# Patient Record
Sex: Female | Born: 1935 | Race: White | Hispanic: No | Smoking: Former smoker
Health system: Southern US, Community
[De-identification: ages and names within clinical notes are randomized; demographics above are authoritative.]

## PROBLEM LIST (undated history)

## (undated) DIAGNOSIS — F329 Major depressive disorder, single episode, unspecified: Secondary | ICD-10-CM

## (undated) DIAGNOSIS — J449 Chronic obstructive pulmonary disease, unspecified: Secondary | ICD-10-CM

## (undated) DIAGNOSIS — F32A Depression, unspecified: Secondary | ICD-10-CM

## (undated) HISTORY — DX: Chronic obstructive pulmonary disease, unspecified: J44.9

## (undated) HISTORY — DX: Depression, unspecified: F32.A

## (undated) HISTORY — PX: TONSILLECTOMY: SUR1361

## (undated) HISTORY — DX: Major depressive disorder, single episode, unspecified: F32.9

---

## 2008-03-13 ENCOUNTER — Ambulatory Visit: Payer: Self-pay | Admitting: Family Medicine

## 2008-04-10 ENCOUNTER — Ambulatory Visit: Payer: Self-pay | Admitting: Family Medicine

## 2009-05-02 IMAGING — CR DG CHEST 2V
1 series · 3 of 3 positions shown · non-contrast
Comparison: none

REASON FOR EXAM: f/u pneumonia    copd
COMMENTS:

[Series 1: view not recorded · 0.17mm/px · 3 of 3 slices shown]
[im 1/3]
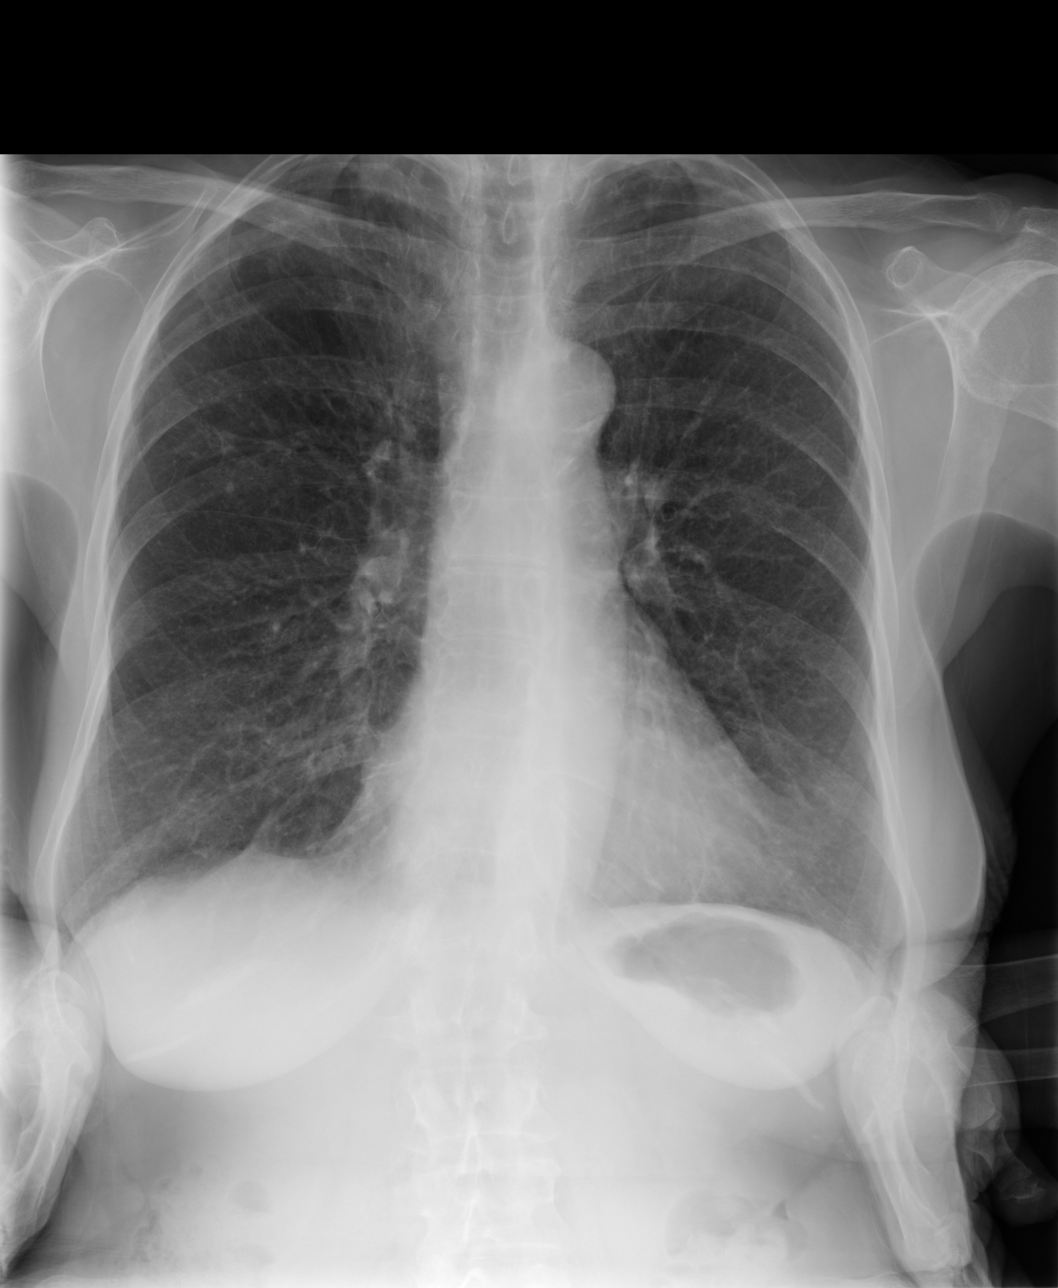
[im 2/3]
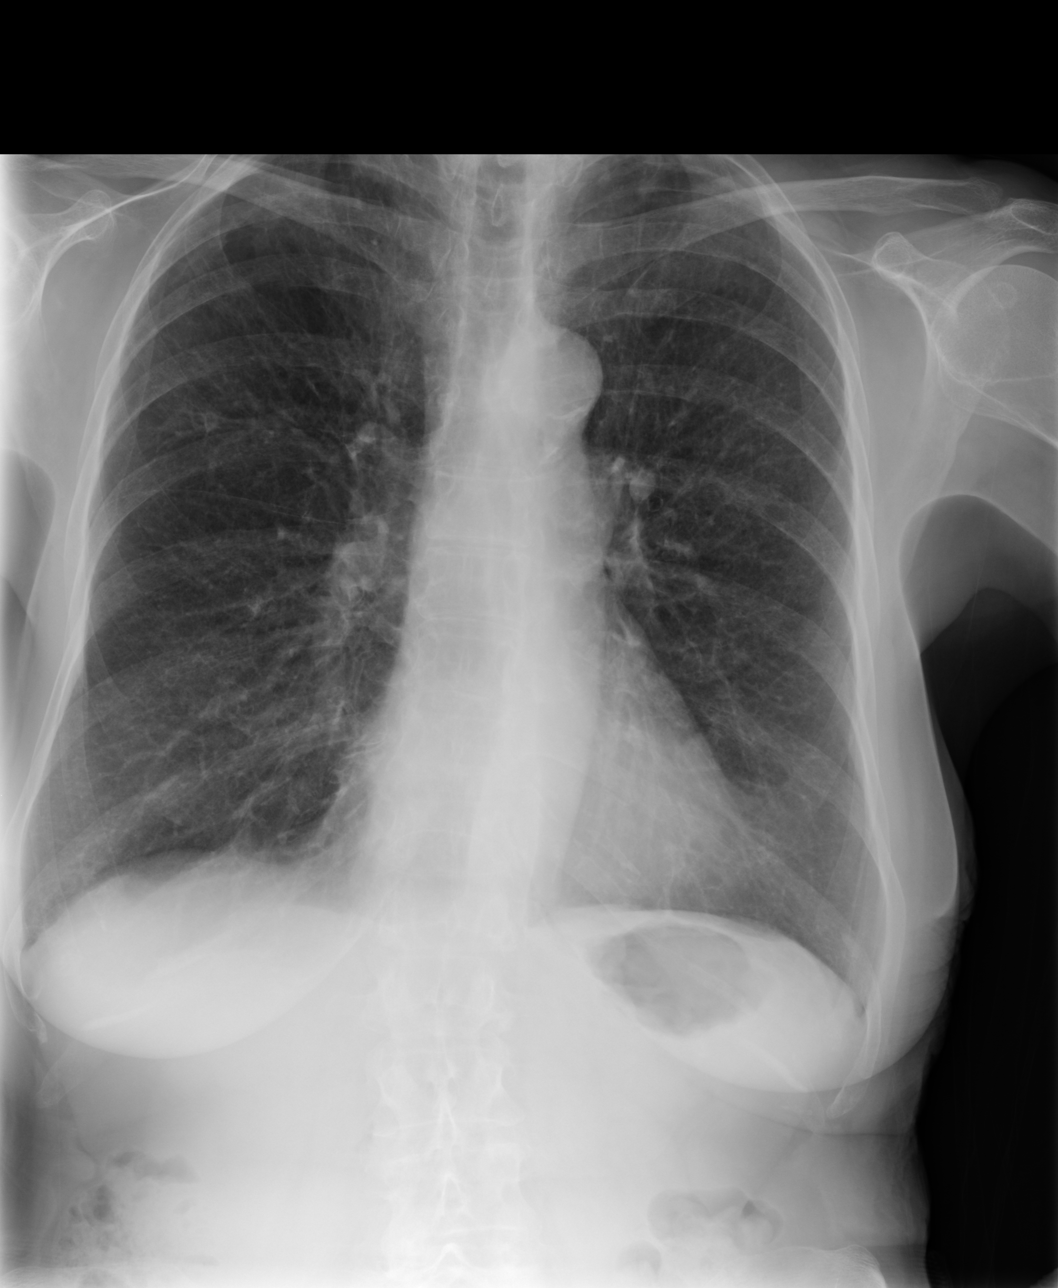
[im 3/3]
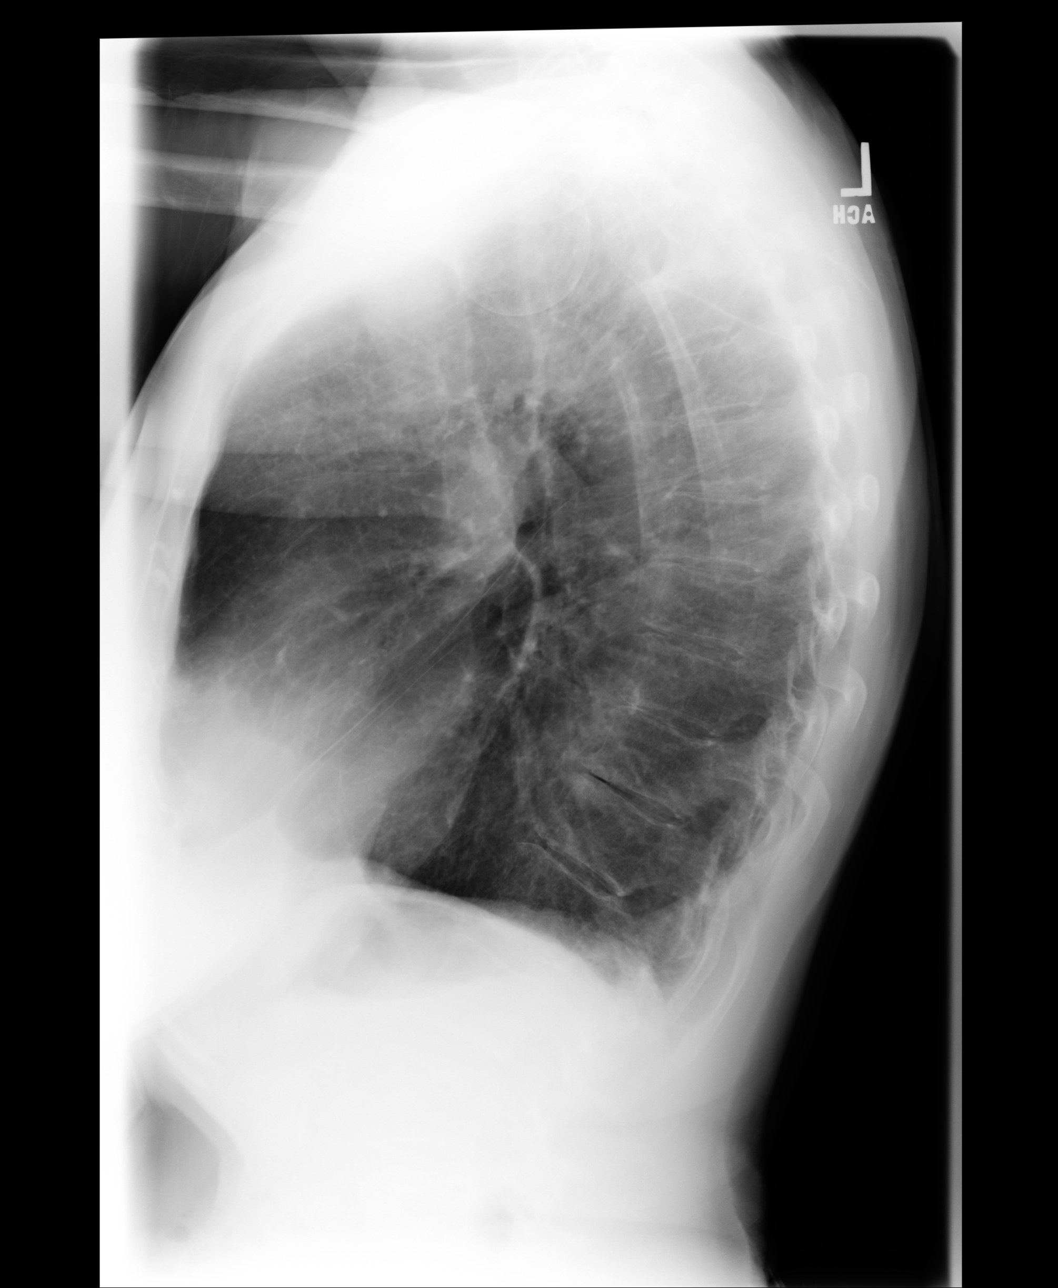

[3 of 3 positions shown; findings below may reference images not displayed]

PROCEDURE:     MDR - MDR CHEST PA(OR AP) AND LATERAL  - April 10, 2008  [DATE]

RESULT:     Comparison is made to a prior exam of 03/13/2008.

There is improvement in the previously observed patchy right upper lobe
infiltrate. A minimal residual increase in density persists in this area
compatible with slight residual infiltrate or possibly developing fibrosis.
Mild pleural and parenchymal fibrosis is again seen at the lung apices. No
new pulmonary infiltrates are seen. The heart size is normal. The chest is
hyperexpanded compatible with COPD.
IMPRESSION: 1.  There has been near complete clearing of the previously noted patchy
infiltrate in the right upper lobe. As mentioned above, minimal residual
density persists in the area compatible with slight residual infiltrate or
developing fibrotic change.
2.  No new infiltrates are seen.
3.  The heart size is normal.
4.  COPD.

## 2012-08-02 LAB — LIPID PANEL
Cholesterol: 202 mg/dL — AB (ref 0–200)
HDL: 70 mg/dL (ref 35–70)
LDL Cholesterol: 107 mg/dL
TRIGLYCERIDES: 124 mg/dL (ref 40–160)

## 2012-08-03 LAB — BASIC METABOLIC PANEL: GLUCOSE: 87 mg/dL

## 2014-05-31 DIAGNOSIS — F339 Major depressive disorder, recurrent, unspecified: Secondary | ICD-10-CM | POA: Insufficient documentation

## 2014-05-31 DIAGNOSIS — J432 Centrilobular emphysema: Secondary | ICD-10-CM | POA: Insufficient documentation

## 2014-12-14 ENCOUNTER — Encounter: Payer: Self-pay | Admitting: Family Medicine

## 2014-12-14 ENCOUNTER — Ambulatory Visit (INDEPENDENT_AMBULATORY_CARE_PROVIDER_SITE_OTHER): Payer: Medicare Other | Admitting: Family Medicine

## 2014-12-14 VITALS — BP 110/60 | HR 80 | Ht 66.0 in | Wt 142.0 lb

## 2014-12-14 DIAGNOSIS — J432 Centrilobular emphysema: Secondary | ICD-10-CM

## 2014-12-14 DIAGNOSIS — J0100 Acute maxillary sinusitis, unspecified: Secondary | ICD-10-CM | POA: Diagnosis not present

## 2014-12-14 DIAGNOSIS — J209 Acute bronchitis, unspecified: Secondary | ICD-10-CM

## 2014-12-14 MED ORDER — FLUTICASONE-SALMETEROL 250-50 MCG/DOSE IN AEPB: 1.0000 | INHALATION_SPRAY | Freq: Two times a day (BID) | RESPIRATORY_TRACT | Status: DC

## 2014-12-14 MED ORDER — ALBUTEROL SULFATE HFA 108 (90 BASE) MCG/ACT IN AERS: 2.0000 | INHALATION_SPRAY | Freq: Four times a day (QID) | RESPIRATORY_TRACT | Status: DC

## 2014-12-14 MED ORDER — GUAIFENESIN-CODEINE 100-10 MG/5ML PO SOLN: 5.0000 mL | Freq: Three times a day (TID) | ORAL | Status: DC | PRN

## 2014-12-14 MED ORDER — AZITHROMYCIN 250 MG PO TABS: ORAL_TABLET | ORAL | Status: DC

## 2014-12-14 NOTE — Progress Notes (Signed)
Name: Marie Martin   MRN: [id number]    DOB: 1931/06/22   Date:12/14/2014       Progress Note  Subjective  Chief Complaint  Chief Complaint  Patient presents with  . Sinusitis    Sinusitis This is a new problem. The current episode started 1 to 4 weeks ago. The problem is unchanged. There has been no fever. She is experiencing no pain. Pertinent negatives include no chills, congestion, coughing, diaphoresis, ear pain, headaches, hoarse voice, neck pain, shortness of breath, sinus pressure, sneezing, sore throat or swollen glands. Past treatments include acetaminophen. The treatment provided mild relief.  Cough This is a new problem. The current episode started 1 to 4 weeks ago. The problem has been waxing and waning. The cough is productive of purulent sputum. Pertinent negatives include no chest pain, chills, ear pain, fever, headaches, heartburn, hemoptysis, myalgias, rash, sore throat, shortness of breath, weight loss or wheezing. Nothing aggravates the symptoms. She has tried a beta-agonist inhaler and steroid inhaler for the symptoms. There is no history of environmental allergies.    No problem-specific assessment & plan notes found for this encounter.   Past Medical History  Diagnosis Date  . Depression   . COPD (chronic obstructive pulmonary disease) [facility](HCC)     Past Surgical History  Procedure Laterality Date  . Tonsillectomy      History reviewed. No pertinent family history.  Social History   Social History  . Marital Status: Widowed    Spouse Name: N/A  . Number of Children: N/A  . Years of Education: N/A   Occupational History  . Not on file.   Social History Main Topics  . Smoking status: Former Games developermoker  . Smokeless tobacco: Not on file  . Alcohol Use: No  . Drug Use: No  . Sexual Activity: Not on file   Other Topics Concern  . Not on file   Social History Narrative    Allergies  Allergen Reactions  . Penicillins      Review of Systems   Constitutional: Negative for fever, chills, weight loss, malaise/fatigue and diaphoresis.  HENT: Negative for congestion, ear discharge, ear pain, hoarse voice, sinus pressure, sneezing and sore throat.   Eyes: Negative for blurred vision.  Respiratory: Negative for cough, hemoptysis, sputum production, shortness of breath and wheezing.   Cardiovascular: Negative for chest pain, palpitations and leg swelling.  Gastrointestinal: Negative for heartburn, nausea, abdominal pain, diarrhea, constipation, blood in stool and melena.  Genitourinary: Negative for dysuria, urgency, frequency and hematuria.  Musculoskeletal: Negative for myalgias, back pain, joint pain and neck pain.  Skin: Negative for rash.  Neurological: Negative for dizziness, tingling, sensory change, focal weakness and headaches.  Endo/Heme/Allergies: Negative for environmental allergies and polydipsia. Does not bruise/bleed easily.  Psychiatric/Behavioral: Negative for depression and suicidal ideas. The patient is not nervous/anxious and does not have insomnia.      Objective  Filed Vitals:   12/14/14 1024  BP: 110/60  Pulse: 80  Height: 5\' 6"  (1.676 m)  Weight: 142 lb (64.411 kg)  SpO2: 94%    Physical Exam  Constitutional: She is well-developed, well-nourished, and in no distress. No distress.  HENT:  Head: Normocephalic and atraumatic.  Right Ear: External ear normal.  Left Ear: External ear normal.  Nose: Nose normal.  Mouth/Throat: Oropharynx is clear and moist.  Eyes: Conjunctivae and EOM are normal. Pupils are equal, round, and reactive to light. Right eye exhibits no discharge. Left eye exhibits no discharge.  Neck:  Normal range of motion. Neck supple. No JVD present. No thyromegaly present.  Cardiovascular: Normal rate, regular rhythm, normal heart sounds and intact distal pulses.  Exam reveals no gallop and no friction rub.   No murmur heard. Pulmonary/Chest: Effort normal and breath sounds normal.   Abdominal: Soft. Bowel sounds are normal. She exhibits no mass. There is no tenderness. There is no guarding.  Musculoskeletal: Normal range of motion. She exhibits no edema.  Lymphadenopathy:    She has no cervical adenopathy.  Neurological: She is alert.  Skin: Skin is warm and dry. She is not diaphoretic.  Psychiatric: Mood and affect normal.      Assessment & Plan  Problem List Items Addressed This Visit      Respiratory   Centriacinar emphysema (HCC)   Relevant Medications   azithromycin (ZITHROMAX) 250 MG tablet   guaiFENesin-codeine 100-10 MG/5ML syrup   albuterol (PROVENTIL HFA;VENTOLIN HFA) 108 (90 BASE) MCG/ACT inhaler   Fluticasone-Salmeterol (ADVAIR) 250-50 MCG/DOSE AEPB    Other Visit Diagnoses    Acute bronchitis, unspecified organism    -  Primary    Relevant Medications    guaiFENesin-codeine 100-10 MG/5ML syrup    Acute maxillary sinusitis, recurrence not specified        Relevant Medications    azithromycin (ZITHROMAX) 250 MG tablet    guaiFENesin-codeine 100-10 MG/5ML syrup         Dr. [facility]  12/14/2014

## 2014-12-17 NOTE — Addendum Note (Signed)
Addended by: Everitt AmberLYNCH, [name] L on: 12/17/2014 04:13 PM   Modules accepted: Orders, Medications

## 2014-12-20 ENCOUNTER — Other Ambulatory Visit: Payer: Self-pay

## 2015-01-14 ENCOUNTER — Ambulatory Visit (INDEPENDENT_AMBULATORY_CARE_PROVIDER_SITE_OTHER): Payer: Medicare Other

## 2015-01-14 DIAGNOSIS — Z23 Encounter for immunization: Secondary | ICD-10-CM

## 2015-04-08 ENCOUNTER — Other Ambulatory Visit: Payer: Self-pay

## 2015-05-10 ENCOUNTER — Other Ambulatory Visit: Payer: Self-pay

## 2015-06-10 ENCOUNTER — Other Ambulatory Visit: Payer: Self-pay

## 2015-06-10 DIAGNOSIS — J432 Centrilobular emphysema: Secondary | ICD-10-CM

## 2015-06-10 MED ORDER — FLUTICASONE-SALMETEROL 250-50 MCG/DOSE IN AEPB: 1.0000 | INHALATION_SPRAY | Freq: Two times a day (BID) | RESPIRATORY_TRACT | Status: DC

## 2015-06-14 ENCOUNTER — Ambulatory Visit (INDEPENDENT_AMBULATORY_CARE_PROVIDER_SITE_OTHER): Payer: Medicare Other | Admitting: Family Medicine

## 2015-06-14 ENCOUNTER — Encounter: Payer: Self-pay | Admitting: Family Medicine

## 2015-06-14 VITALS — BP 120/62 | HR 64 | Ht 66.0 in | Wt 144.0 lb

## 2015-06-14 DIAGNOSIS — F339 Major depressive disorder, recurrent, unspecified: Secondary | ICD-10-CM | POA: Diagnosis not present

## 2015-06-14 DIAGNOSIS — J432 Centrilobular emphysema: Secondary | ICD-10-CM | POA: Diagnosis not present

## 2015-06-14 MED ORDER — FLUTICASONE-SALMETEROL 250-50 MCG/DOSE IN AEPB: 1.0000 | INHALATION_SPRAY | Freq: Two times a day (BID) | RESPIRATORY_TRACT | Status: DC

## 2015-06-14 MED ORDER — PROVENTIL HFA 108 (90 BASE) MCG/ACT IN AERS: 2.0000 | INHALATION_SPRAY | RESPIRATORY_TRACT | Status: DC | PRN

## 2015-06-14 MED ORDER — SERTRALINE HCL 100 MG PO TABS
100.0000 mg | ORAL_TABLET | Freq: Every day | ORAL | Status: DC
Start: 2015-06-14 — End: 2015-12-02

## 2015-06-14 NOTE — Progress Notes (Signed)
Name: Marie Martin   MRN: [id number]    DOB: 1931/05/07   Date:06/14/2015       Progress Note  Subjective  Chief Complaint  Chief Complaint  Patient presents with  . COPD  . Depression    Depression        The patient presents with no depression.  This is a chronic problem.  The current episode started more than 1 year ago.   The onset quality is undetermined.   The problem has been gradually improving since onset.  Associated symptoms include sad.  Associated symptoms include no decreased concentration, no fatigue, no helplessness, no hopelessness, does not have insomnia, not irritable, no restlessness, no decreased interest, no appetite change, no body aches, no myalgias, no headaches, no indigestion and no suicidal ideas.  Past treatments include SSRIs - Selective serotonin reuptake inhibitors.  Compliance with treatment is good.   Pertinent negatives include no chronic fatigue syndrome, no chronic pain, no fibromyalgia, no hypothyroidism, no thyroid problem, no physical disability, no terminal illness, no eating disorder, no depression and no mental health disorder. Shortness of Breath This is a chronic problem. The current episode started more than 1 year ago. The problem occurs intermittently. The problem has been waxing and waning. Pertinent negatives include no abdominal pain, chest pain, ear pain, fever, headaches, leg swelling, neck pain, rash, sore throat, sputum production or wheezing. The symptoms are aggravated by any activity, weather changes and pollens. She has tried beta agonist inhalers and steroid inhalers for the symptoms. The treatment provided mild relief. Her past medical history is significant for chronic lung disease.    No problem-specific assessment & plan notes found for this encounter.   Past Medical History  Diagnosis Date  . Depression   . COPD (chronic obstructive pulmonary disease) [facility](HCC)     Past Surgical History  Procedure Laterality Date  .  Tonsillectomy      History reviewed. No pertinent family history.  Social History   Social History  . Marital Status: Widowed    Spouse Name: N/A  . Number of Children: N/A  . Years of Education: N/A   Occupational History  . Not on file.   Social History Main Topics  . Smoking status: Former Games developermoker  . Smokeless tobacco: Not on file  . Alcohol Use: No  . Drug Use: No  . Sexual Activity: Not on file   Other Topics Concern  . Not on file   Social History Narrative    Allergies  Allergen Reactions  . Penicillins      Review of Systems  Constitutional: Negative for fever, chills, weight loss, malaise/fatigue, appetite change and fatigue.  HENT: Negative for ear discharge, ear pain and sore throat.   Eyes: Negative for blurred vision.  Respiratory: Negative for cough, sputum production, shortness of breath and wheezing.   Cardiovascular: Negative for chest pain, palpitations and leg swelling.  Gastrointestinal: Negative for heartburn, nausea, abdominal pain, diarrhea, constipation, blood in stool and melena.  Genitourinary: Negative for dysuria, urgency, frequency and hematuria.  Musculoskeletal: Negative for myalgias, back pain, joint pain and neck pain.  Skin: Negative for rash.  Neurological: Negative for dizziness, tingling, sensory change, focal weakness and headaches.  Endo/Heme/Allergies: Negative for environmental allergies and polydipsia. Does not bruise/bleed easily.  Psychiatric/Behavioral: Positive for depression. Negative for suicidal ideas and decreased concentration. The patient is not nervous/anxious and does not have insomnia.      Objective  Filed Vitals:   06/14/15 0946  BP:  120/62  Pulse: 64  Height:  (1.676 m)  Weight: 144 lb (65.318 kg)  SpO2: 98%    Physical Exam  Constitutional: She is well-developed, well-nourished, and in no distress. She is not irritable. No distress.  HENT:  Head: Normocephalic and atraumatic.  Right Ear:  External ear normal.  Left Ear: External ear normal.  Nose: Nose normal.  Mouth/Throat: Oropharynx is clear and moist.  Eyes: Conjunctivae and EOM are normal. Pupils are equal, round, and reactive to light. Right eye exhibits no discharge. Left eye exhibits no discharge.  Neck: Normal range of motion. Neck supple. No JVD present. No thyromegaly present.  Cardiovascular: Normal rate, regular rhythm, normal heart sounds and intact distal pulses.  Exam reveals no gallop and no friction rub.   No murmur heard. Pulmonary/Chest: Effort normal and breath sounds normal.  Abdominal: Soft. Bowel sounds are normal. She exhibits no mass. There is no tenderness. There is no guarding.  Musculoskeletal: Normal range of motion. She exhibits no edema.  Lymphadenopathy:    She has no cervical adenopathy.  Neurological: She is alert. She has normal reflexes.  Skin: Skin is warm and dry. No rash noted. She is not diaphoretic. No erythema.  Psychiatric: Mood and affect normal.  Nursing note and vitals reviewed.     Assessment & Plan  Problem List Items Addressed This Visit      Respiratory   Centriacinar emphysema (HCC) - Primary   Relevant Medications   Fluticasone-Salmeterol (ADVAIR) 250-50 MCG/DOSE AEPB   PROVENTIL HFA 108 (90 Base) MCG/ACT inhaler     Other   Recurrent major depressive episodes (HCC)   Relevant Medications   sertraline (ZOLOFT) 100 MG tablet        Dr. [facility]  06/14/2015

## 2015-06-17 ENCOUNTER — Ambulatory Visit: Payer: Medicare Other | Admitting: Family Medicine

## 2015-12-02 ENCOUNTER — Ambulatory Visit (INDEPENDENT_AMBULATORY_CARE_PROVIDER_SITE_OTHER): Payer: Medicare Other | Admitting: Family Medicine

## 2015-12-02 ENCOUNTER — Encounter: Payer: Self-pay | Admitting: Family Medicine

## 2015-12-02 VITALS — BP 120/80 | HR 80 | Ht 66.0 in | Wt 151.0 lb

## 2015-12-02 DIAGNOSIS — J432 Centrilobular emphysema: Secondary | ICD-10-CM | POA: Diagnosis not present

## 2015-12-02 DIAGNOSIS — R0600 Dyspnea, unspecified: Secondary | ICD-10-CM

## 2015-12-02 DIAGNOSIS — R0609 Other forms of dyspnea: Secondary | ICD-10-CM

## 2015-12-02 DIAGNOSIS — Z23 Encounter for immunization: Secondary | ICD-10-CM | POA: Diagnosis not present

## 2015-12-02 DIAGNOSIS — F339 Major depressive disorder, recurrent, unspecified: Secondary | ICD-10-CM | POA: Diagnosis not present

## 2015-12-02 DIAGNOSIS — E782 Mixed hyperlipidemia: Secondary | ICD-10-CM | POA: Diagnosis not present

## 2015-12-02 MED ORDER — PROVENTIL HFA 108 (90 BASE) MCG/ACT IN AERS: 2.0000 | INHALATION_SPRAY | RESPIRATORY_TRACT | 2 refills | Status: DC | PRN

## 2015-12-02 MED ORDER — SERTRALINE HCL 100 MG PO TABS
100.0000 mg | ORAL_TABLET | Freq: Every day | ORAL | 2 refills | Status: DC
Start: 2015-12-02 — End: 2016-11-10

## 2015-12-02 MED ORDER — FLUTICASONE-SALMETEROL 250-50 MCG/DOSE IN AEPB: 1.0000 | INHALATION_SPRAY | Freq: Two times a day (BID) | RESPIRATORY_TRACT | 2 refills | Status: DC

## 2015-12-02 NOTE — Addendum Note (Signed)
Addended by: Everitt AmberLYNCH, Terez Freimark L on: 12/02/2015 10:29 AM   Modules accepted: Orders

## 2015-12-02 NOTE — Progress Notes (Signed)
Name: Marie Martin   MRN: [id number]    DOB: 03/02/1931   Date:12/02/2015       Progress Note  Subjective  Chief Complaint  Chief Complaint  Patient presents with  . Depression  . COPD    Depression       The patient presents with depression.  This is a chronic problem.  The current episode started more than 1 year ago.   The onset quality is sudden.   The problem occurs daily.  The problem has been gradually improving since onset.  Associated symptoms include sad.  Associated symptoms include no decreased concentration, no fatigue, no helplessness, no hopelessness, does not have insomnia, not irritable, no restlessness, no decreased interest, no appetite change, no body aches, no myalgias, no headaches, no indigestion and no suicidal ideas.     The symptoms are aggravated by work stress.  Past treatments include SSRIs - Selective serotonin reuptake inhibitors.  Compliance with treatment is good.  Previous treatment provided mild relief.  Risk factors include a change in medication usage/dosage.   Past medical history includes depression.     Pertinent negatives include no chronic fatigue syndrome and no anxiety.   No problem-specific Assessment & Plan notes found for this encounter.   Past Medical History:  Diagnosis Date  . COPD (chronic obstructive pulmonary disease) (HCC)   . Depression     Past Surgical History:  Procedure Laterality Date  . TONSILLECTOMY      No family history on file.  Social History   Social History  . Marital status: Widowed    Spouse name: N/A  . Number of children: N/A  . Years of education: N/A   Occupational History  . Not on file.   Social History Main Topics  . Smoking status: Former Games developermoker  . Smokeless tobacco: Not on file  . Alcohol use No  . Drug use: No  . Sexual activity: Not on file   Other Topics Concern  . Not on file   Social History Narrative  . No narrative on file    Allergies  Allergen Reactions  . Penicillins       Review of Systems  Constitutional: Negative for appetite change, chills, fatigue, fever, malaise/fatigue and weight loss.  HENT: Negative for ear discharge, ear pain and sore throat.   Eyes: Negative for blurred vision.  Respiratory: Negative for cough, sputum production, shortness of breath and wheezing.   Cardiovascular: Negative for chest pain, palpitations and leg swelling.  Gastrointestinal: Negative for abdominal pain, blood in stool, constipation, diarrhea, heartburn, melena and nausea.  Genitourinary: Negative for dysuria, frequency, hematuria and urgency.  Musculoskeletal: Negative for back pain, joint pain, myalgias and neck pain.  Skin: Negative for rash.  Neurological: Negative for dizziness, tingling, sensory change, focal weakness and headaches.  Endo/Heme/Allergies: Negative for environmental allergies and polydipsia. Does not bruise/bleed easily.  Psychiatric/Behavioral: Positive for depression. Negative for decreased concentration and suicidal ideas. The patient is not nervous/anxious and does not have insomnia.      Objective  Vitals:   12/02/15 0954  BP: 120/80  Pulse: 80  Weight: 151 lb (68.5 kg)  Height: 5\' 6"  (1.676 m)    Physical Exam  Constitutional: She is well-developed, well-nourished, and in no distress. She is not irritable. No distress.  HENT:  Head: Normocephalic and atraumatic.  Right Ear: External ear normal.  Left Ear: External ear normal.  Nose: Nose normal.  Mouth/Throat: Oropharynx is clear and moist.  Eyes: Conjunctivae and  EOM are normal. Pupils are equal, round, and reactive to light. Right eye exhibits no discharge. Left eye exhibits no discharge.  Neck: Normal range of motion. Neck supple. No JVD present. No thyromegaly present.  Cardiovascular: Normal rate, regular rhythm, normal heart sounds and intact distal pulses.  Exam reveals no gallop and no friction rub.   No murmur heard. Pulmonary/Chest: Effort normal and breath sounds  normal. No respiratory distress. She has no wheezes. She has no rales. She exhibits no tenderness.  Abdominal: Soft. Bowel sounds are normal. She exhibits no mass. There is no tenderness. There is no guarding.  Musculoskeletal: Normal range of motion. She exhibits no edema.  Lymphadenopathy:    She has no cervical adenopathy.  Neurological: She is alert.  Skin: Skin is warm and dry. She is not diaphoretic.  Psychiatric: Mood and affect normal.  Nursing note and vitals reviewed.     Assessment & Plan  Problem List Items Addressed This Visit      Respiratory   Centriacinar emphysema (HCC) - Primary   Relevant Medications   Fluticasone-Salmeterol (ADVAIR) 250-50 MCG/DOSE AEPB   PROVENTIL HFA 108 (90 Base) MCG/ACT inhaler     Other   Recurrent major depressive episodes (HCC)   Relevant Medications   sertraline (ZOLOFT) 100 MG tablet    Other Visit Diagnoses    Mixed hyperlipidemia       Relevant Orders   Lipid Profile   Dyspnea on exertion       Relevant Orders   Renal Function Panel        Dr. Elizabeth Sauer [facility]  12/02/15

## 2015-12-03 LAB — RENAL FUNCTION PANEL
Albumin: 4.1 g/dL (ref 3.5–4.7)
BUN/Creatinine Ratio: 14 (ref 12–28)
BUN: 15 mg/dL (ref 8–27)
CALCIUM: 9.2 mg/dL (ref 8.7–10.3)
CO2: 25 mmol/L (ref 18–29)
Chloride: 104 mmol/L (ref 96–106)
Creatinine, Ser: 1.08 mg/dL — ABNORMAL HIGH (ref 0.57–1.00)
GFR calc Af Amer: 55 mL/min/{1.73_m2} — ABNORMAL LOW (ref >59)
GFR calc non Af Amer: 48 mL/min/{1.73_m2} — ABNORMAL LOW (ref >59)
Glucose: 89 mg/dL (ref 65–99)
PHOSPHORUS: 3.8 mg/dL (ref 2.5–4.5)
Potassium: 4.7 mmol/L (ref 3.5–5.2)
Sodium: 143 mmol/L (ref 134–144)

## 2015-12-03 LAB — LIPID PANEL
CHOLESTEROL TOTAL: 232 mg/dL — AB (ref 100–199)
Chol/HDL Ratio: 3.7 ratio units (ref 0.0–4.4)
HDL: 62 mg/dL (ref >39)
LDL Calculated: 141 mg/dL — ABNORMAL HIGH (ref 0–99)
TRIGLYCERIDES: 143 mg/dL (ref 0–149)
VLDL Cholesterol Cal: 29 mg/dL (ref 5–40)

## 2016-01-27 ENCOUNTER — Encounter: Payer: Self-pay | Admitting: Family Medicine

## 2016-01-27 ENCOUNTER — Ambulatory Visit (INDEPENDENT_AMBULATORY_CARE_PROVIDER_SITE_OTHER): Payer: Medicare Other | Admitting: Family Medicine

## 2016-01-27 VITALS — BP 112/78 | HR 73 | Temp 98.0°F | Resp 16 | Ht 66.0 in | Wt 149.2 lb

## 2016-01-27 DIAGNOSIS — J432 Centrilobular emphysema: Secondary | ICD-10-CM | POA: Diagnosis not present

## 2016-01-27 DIAGNOSIS — J40 Bronchitis, not specified as acute or chronic: Secondary | ICD-10-CM

## 2016-01-27 MED ORDER — AZITHROMYCIN 250 MG PO TABS: ORAL_TABLET | ORAL | 0 refills | Status: DC

## 2016-01-27 MED ORDER — GUAIFENESIN-CODEINE 100-10 MG/5ML PO SYRP: 5.0000 mL | ORAL_SOLUTION | Freq: Three times a day (TID) | ORAL | 0 refills | Status: DC | PRN

## 2016-01-27 NOTE — Progress Notes (Signed)
Name: Marie Martin   MRN: [id number]    DOB: [birth year]   Date:01/27/2016       Progress Note  Subjective  Chief Complaint  Chief Complaint  Patient presents with  . Cough    4-5 days     Cough  This is a chronic problem. The current episode started in the past 7 days. The problem has been gradually worsening. The problem occurs constantly. The cough is non-productive. Associated symptoms include nasal congestion, shortness of breath and wheezing. Pertinent negatives include no chest pain, chills, ear congestion, ear pain, fever, headaches, heartburn, hemoptysis, myalgias, postnasal drip, rash, rhinorrhea, sore throat, sweats or weight loss. The symptoms are aggravated by cold air. There is no history of environmental allergies.    No problem-specific Assessment & Plan notes found for this encounter.   Past Medical History:  Diagnosis Date  . COPD (chronic obstructive pulmonary disease) (HCC)   . Depression     Past Surgical History:  Procedure Laterality Date  . TONSILLECTOMY      History reviewed. No pertinent family history.  Social History   Social History  . Marital status: Widowed    Spouse name: N/A  . Number of children: N/A  . Years of education: N/A   Occupational History  . Not on file.   Social History Main Topics  . Smoking status: Former Games developermoker  . Smokeless tobacco: Never Used  . Alcohol use No  . Drug use: No  . Sexual activity: Not on file   Other Topics Concern  . Not on file   Social History Narrative  . No narrative on file    Allergies  Allergen Reactions  . Amoxicillin-Pot Clavulanate Nausea Only and Other (See Comments)    Other Reaction: trouble swallowing  . Penicillins      Review of Systems  Constitutional: Negative for chills, fever, malaise/fatigue and weight loss.  HENT: Negative for ear discharge, ear pain, postnasal drip, rhinorrhea and sore throat.   Eyes: Negative for blurred vision.  Respiratory: Positive for  cough, shortness of breath and wheezing. Negative for hemoptysis and sputum production.   Cardiovascular: Negative for chest pain, palpitations and leg swelling.  Gastrointestinal: Negative for abdominal pain, blood in stool, constipation, diarrhea, heartburn, melena and nausea.  Genitourinary: Negative for dysuria, frequency, hematuria and urgency.  Musculoskeletal: Negative for back pain, joint pain, myalgias and neck pain.  Skin: Negative for rash.  Neurological: Negative for dizziness, tingling, sensory change, focal weakness and headaches.  Endo/Heme/Allergies: Negative for environmental allergies and polydipsia. Does not bruise/bleed easily.  Psychiatric/Behavioral: Negative for depression and suicidal ideas. The patient is not nervous/anxious and does not have insomnia.      Objective  Vitals:   01/27/16 1047  BP: 112/78  Pulse: 73  Resp: 16  Temp: 98 F (36.7 C)  SpO2: 93%  Weight: 149 lb 3.2 oz (67.7 kg)  Height: 5\' 6"  (1.676 m)    Physical Exam  Constitutional: She is well-developed, well-nourished, and in no distress. No distress.  HENT:  Head: Normocephalic and atraumatic.  Right Ear: External ear normal.  Left Ear: External ear normal.  Nose: Nose normal.  Mouth/Throat: Oropharynx is clear and moist.  Eyes: Conjunctivae and EOM are normal. Pupils are equal, round, and reactive to light. Right eye exhibits no discharge. Left eye exhibits no discharge.  Neck: Normal range of motion. Neck supple. No JVD present. No thyromegaly present.  Cardiovascular: Normal rate, regular rhythm, normal heart sounds and intact distal  pulses.  Exam reveals no gallop and no friction rub.   No murmur heard. Pulmonary/Chest: Effort normal. She has wheezes. She has no rales.  Abdominal: Soft. Bowel sounds are normal. She exhibits no mass. There is no tenderness. There is no guarding.  Musculoskeletal: Normal range of motion. She exhibits no edema.  Lymphadenopathy:    She has no  cervical adenopathy.  Neurological: She is alert. She has normal reflexes.  Skin: Skin is warm and dry. She is not diaphoretic.  Psychiatric: Mood and affect normal.  Nursing note and vitals reviewed.     Assessment & Plan  Problem List Items Addressed This Visit      Respiratory   Centriacinar emphysema (HCC) - Primary   Relevant Medications   Dextromethorphan-Guaifenesin (MUCINEX DM MAXIMUM STRENGTH PO)   azithromycin (ZITHROMAX) 250 MG tablet   guaiFENesin-codeine (ROBITUSSIN AC) 100-10 MG/5ML syrup    Other Visit Diagnoses    Bronchitis       Relevant Medications   guaiFENesin-codeine (ROBITUSSIN AC) 100-10 MG/5ML syrup        Dr. Hayden [facility]  01/27/16

## 2016-01-28 ENCOUNTER — Ambulatory Visit: Payer: Medicare Other | Admitting: Family Medicine

## 2016-02-14 ENCOUNTER — Other Ambulatory Visit: Payer: Self-pay

## 2016-02-14 DIAGNOSIS — J432 Centrilobular emphysema: Secondary | ICD-10-CM

## 2016-02-14 MED ORDER — PROVENTIL HFA 108 (90 BASE) MCG/ACT IN AERS: 2.0000 | INHALATION_SPRAY | RESPIRATORY_TRACT | 2 refills | Status: DC | PRN

## 2016-02-18 ENCOUNTER — Other Ambulatory Visit: Payer: Self-pay

## 2016-06-05 ENCOUNTER — Encounter: Payer: Self-pay | Admitting: Family Medicine

## 2016-06-05 ENCOUNTER — Ambulatory Visit (INDEPENDENT_AMBULATORY_CARE_PROVIDER_SITE_OTHER): Payer: Medicare Other | Admitting: Family Medicine

## 2016-06-05 VITALS — BP 120/68 | HR 70 | Ht 66.0 in | Wt 147.0 lb

## 2016-06-05 DIAGNOSIS — H1013 Acute atopic conjunctivitis, bilateral: Secondary | ICD-10-CM | POA: Diagnosis not present

## 2016-06-05 DIAGNOSIS — J0100 Acute maxillary sinusitis, unspecified: Secondary | ICD-10-CM

## 2016-06-05 DIAGNOSIS — J432 Centrilobular emphysema: Secondary | ICD-10-CM

## 2016-06-05 DIAGNOSIS — J40 Bronchitis, not specified as acute or chronic: Secondary | ICD-10-CM

## 2016-06-05 MED ORDER — GUAIFENESIN-CODEINE 100-10 MG/5ML PO SYRP: 5.0000 mL | ORAL_SOLUTION | Freq: Three times a day (TID) | ORAL | 0 refills | Status: DC | PRN

## 2016-06-05 MED ORDER — AZITHROMYCIN 250 MG PO TABS: ORAL_TABLET | ORAL | 1 refills | Status: DC

## 2016-06-05 NOTE — Progress Notes (Signed)
Name: Marie Martin   MRN: [id number]    DOB: 04-08-1931   Date:06/05/2016       Progress Note  Subjective  Chief Complaint  Chief Complaint  Patient presents with  . Sinusitis    cough and cong    Sinusitis  This is a new problem. The current episode started 1 to 4 weeks ago. The problem has been waxing and waning since onset. There has been no fever. Her pain is at a severity of 4/10. The pain is mild. Associated symptoms include coughing and shortness of breath. Pertinent negatives include no chills, congestion, diaphoresis, ear pain, headaches, hoarse voice, neck pain, sinus pressure, sneezing, sore throat or swollen glands. Past treatments include nothing. The treatment provided mild relief.  Shortness of Breath  This is a recurrent problem. The current episode started 1 to 4 weeks ago. The problem has been waxing and waning. Pertinent negatives include no abdominal pain, chest pain, claudication, coryza, ear pain, fever, headaches, hemoptysis, leg pain, leg swelling, neck pain, orthopnea, PND, rash, rhinorrhea, sore throat, sputum production, swollen glands, syncope, vomiting or wheezing. The symptoms are aggravated by pollens. She has tried beta agonist inhalers for the symptoms. Her past medical history is significant for allergies and chronic lung disease.  Cough  This is a new problem. The current episode started in the past 7 days. The problem has been waxing and waning. The cough is productive of purulent sputum. Associated symptoms include nasal congestion, postnasal drip and shortness of breath. Pertinent negatives include no chest pain, chills, ear pain, fever, headaches, heartburn, hemoptysis, myalgias, rash, rhinorrhea, sore throat, weight loss or wheezing. Associated symptoms comments: yellow. The symptoms are aggravated by pollens. She has tried steroid inhaler and a beta-agonist inhaler for the symptoms. The treatment provided mild relief. There is no history of environmental  allergies.    No problem-specific Assessment & Plan notes found for this encounter.   Past Medical History:  Diagnosis Date  . COPD (chronic obstructive pulmonary disease) (HCC)   . Depression     Past Surgical History:  Procedure Laterality Date  . TONSILLECTOMY      No family history on file.  Social History   Social History  . Marital status: Widowed    Spouse name: N/A  . Number of children: N/A  . Years of education: N/A   Occupational History  . Not on file.   Social History Main Topics  . Smoking status: Former Games developer  . Smokeless tobacco: Never Used  . Alcohol use No  . Drug use: No  . Sexual activity: Not on file   Other Topics Concern  . Not on file   Social History Narrative  . No narrative on file    Allergies  Allergen Reactions  . Amoxicillin-Pot Clavulanate Nausea Only and Other (See Comments)    Other Reaction: trouble swallowing  . Penicillins     Outpatient Medications Prior to Visit  Medication Sig Dispense Refill  . Dextromethorphan-Guaifenesin (MUCINEX DM MAXIMUM STRENGTH PO) Take by mouth.    . Fluticasone-Salmeterol (ADVAIR) 250-50 MCG/DOSE AEPB Inhale 1 puff into the lungs 2 (two) times daily. 3 each 2  . sertraline (ZOLOFT) 100 MG tablet Take 1 tablet (100 mg total) by mouth daily. 90 tablet 2  . azithromycin (ZITHROMAX) 250 MG tablet 2 today then 1 a day for 4 days 6 tablet 0  . guaiFENesin-codeine (ROBITUSSIN AC) 100-10 MG/5ML syrup Take 5 mLs by mouth 3 (three) times daily as needed for  cough. 150 mL 0  . PROVENTIL HFA 108 (90 Base) MCG/ACT inhaler Inhale 2 puffs into the lungs as needed. 3 Inhaler 2   No facility-administered medications prior to visit.     Review of Systems  Constitutional: Negative for chills, diaphoresis, fever, malaise/fatigue and weight loss.  HENT: Positive for postnasal drip. Negative for congestion, ear discharge, ear pain, hoarse voice, rhinorrhea, sinus pressure, sneezing and sore throat.   Eyes:  Negative for blurred vision.  Respiratory: Positive for cough and shortness of breath. Negative for hemoptysis, sputum production and wheezing.   Cardiovascular: Negative for chest pain, palpitations, orthopnea, claudication, leg swelling, syncope and PND.  Gastrointestinal: Negative for abdominal pain, blood in stool, constipation, diarrhea, heartburn, melena, nausea and vomiting.  Genitourinary: Negative for dysuria, frequency, hematuria and urgency.  Musculoskeletal: Negative for back pain, joint pain, myalgias and neck pain.  Skin: Negative for rash.  Neurological: Negative for dizziness, tingling, sensory change, focal weakness and headaches.  Endo/Heme/Allergies: Negative for environmental allergies and polydipsia. Does not bruise/bleed easily.  Psychiatric/Behavioral: Negative for depression and suicidal ideas. The patient is not nervous/anxious and does not have insomnia.      Objective  Vitals:   06/05/16 1058  BP: 120/68  Pulse: 70  SpO2: 95%  Weight: 147 lb (66.7 kg)  Height:  (1.676 m)    Physical Exam  Constitutional: She is well-developed, well-nourished, and in no distress. No distress.  HENT:  Head: Normocephalic and atraumatic.  Right Ear: External ear normal.  Left Ear: External ear normal.  Nose: Nose normal.  Mouth/Throat: Oropharynx is clear and moist.  Eyes: EOM are normal. Pupils are equal, round, and reactive to light. Right eye exhibits no discharge. Left eye exhibits no discharge. Right conjunctiva is injected. Left conjunctiva is injected.  Neck: Normal range of motion. Neck supple. No JVD present. No thyromegaly present.  Cardiovascular: Normal rate, regular rhythm, normal heart sounds and intact distal pulses.  Exam reveals no gallop and no friction rub.   No murmur heard. Pulmonary/Chest: Effort normal. She has wheezes. She has no rales.  Abdominal: Soft. Bowel sounds are normal. She exhibits no mass. There is no tenderness. There is no  guarding.  Musculoskeletal: Normal range of motion. She exhibits no edema.  Lymphadenopathy:    She has no cervical adenopathy.  Neurological: She is alert. She has normal reflexes.  Skin: Skin is warm and dry. She is not diaphoretic.  Psychiatric: Mood and affect normal.  Nursing note and vitals reviewed.     Assessment & Plan  Problem List Items Addressed This Visit      Respiratory   Centriacinar emphysema (HCC) - Primary   Relevant Medications   albuterol (PROAIR HFA) 108 (90 Base) MCG/ACT inhaler   azithromycin (ZITHROMAX) 250 MG tablet   guaiFENesin-codeine (ROBITUSSIN AC) 100-10 MG/5ML syrup    Other Visit Diagnoses    Acute maxillary sinusitis, recurrence not specified       Relevant Medications   azithromycin (ZITHROMAX) 250 MG tablet   guaiFENesin-codeine (ROBITUSSIN AC) 100-10 MG/5ML syrup   Bronchitis       Relevant Medications   azithromycin (ZITHROMAX) 250 MG tablet   guaiFENesin-codeine (ROBITUSSIN AC) 100-10 MG/5ML syrup   Allergic conjunctivitis of both eyes          Meds ordered this encounter  Medications  . azithromycin (ZITHROMAX) 250 MG tablet    Sig: 2 today then 1 a day for 4 days    Dispense:  6 tablet  Refill:  1  . guaiFENesin-codeine (ROBITUSSIN AC) 100-10 MG/5ML syrup    Sig: Take 5 mLs by mouth 3 (three) times daily as needed for cough.    Dispense:  150 mL    Refill:  0      Dr. Elizabeth Sauer [facility]  06/05/16

## 2016-06-11 ENCOUNTER — Telehealth: Payer: Self-pay | Admitting: *Deleted

## 2016-06-11 NOTE — Telephone Encounter (Signed)
Patient called and wants to know if she needs to take the second dose of her z pack. She has a refill of that .  She wants to talk to the nurse before she picks up the refill. Patient is requesting a call back.Her number is [phone]. Thank you

## 2016-06-11 NOTE — Telephone Encounter (Signed)
Pt is on day 7 of ZPack today- was told to wait the full 10 days before starting next ZPack Rx.- pt notified

## 2016-06-15 ENCOUNTER — Other Ambulatory Visit: Payer: Self-pay

## 2016-06-15 DIAGNOSIS — R059 Cough, unspecified: Secondary | ICD-10-CM

## 2016-06-15 DIAGNOSIS — R05 Cough: Secondary | ICD-10-CM

## 2016-06-15 MED ORDER — DOXYCYCLINE HYCLATE 100 MG PO TABS: 100.0000 mg | ORAL_TABLET | Freq: Two times a day (BID) | ORAL | 0 refills | Status: DC

## 2016-06-25 ENCOUNTER — Other Ambulatory Visit: Payer: Self-pay

## 2016-06-25 ENCOUNTER — Telehealth: Payer: Self-pay

## 2016-06-25 MED ORDER — FLUCONAZOLE 150 MG PO TABS: 150.0000 mg | ORAL_TABLET | Freq: Once | ORAL | 0 refills | Status: AC

## 2016-06-25 NOTE — Telephone Encounter (Signed)
Pt called wanting a diflucan called in for possible yeast infection- [facility] [address] She finished Doxy on 17th of May

## 2016-06-29 ENCOUNTER — Ambulatory Visit: Payer: Medicare Other | Admitting: Family Medicine

## 2016-06-29 ENCOUNTER — Other Ambulatory Visit: Payer: Self-pay

## 2016-08-03 ENCOUNTER — Other Ambulatory Visit: Payer: Self-pay

## 2016-08-03 DIAGNOSIS — J432 Centrilobular emphysema: Secondary | ICD-10-CM

## 2016-08-03 MED ORDER — FLUTICASONE-SALMETEROL 250-50 MCG/DOSE IN AEPB: 1.0000 | INHALATION_SPRAY | Freq: Two times a day (BID) | RESPIRATORY_TRACT | 2 refills | Status: DC

## 2016-10-01 ENCOUNTER — Other Ambulatory Visit: Payer: Self-pay

## 2016-10-01 ENCOUNTER — Ambulatory Visit (INDEPENDENT_AMBULATORY_CARE_PROVIDER_SITE_OTHER): Payer: Medicare Other | Admitting: Family Medicine

## 2016-10-01 ENCOUNTER — Encounter: Payer: Self-pay | Admitting: Family Medicine

## 2016-10-01 VITALS — BP 120/68 | HR 64 | Ht 66.0 in | Wt 144.0 lb

## 2016-10-01 DIAGNOSIS — F339 Major depressive disorder, recurrent, unspecified: Secondary | ICD-10-CM | POA: Diagnosis not present

## 2016-10-01 DIAGNOSIS — B373 Candidiasis of vulva and vagina: Secondary | ICD-10-CM | POA: Diagnosis not present

## 2016-10-01 DIAGNOSIS — B3731 Acute candidiasis of vulva and vagina: Secondary | ICD-10-CM

## 2016-10-01 DIAGNOSIS — Z23 Encounter for immunization: Secondary | ICD-10-CM | POA: Diagnosis not present

## 2016-10-01 MED ORDER — NYSTATIN 100000 UNIT/GM EX CREA: 1.0000 "application " | TOPICAL_CREAM | Freq: Two times a day (BID) | CUTANEOUS | 0 refills | Status: DC

## 2016-10-01 MED ORDER — FLUCONAZOLE 150 MG PO TABS: 150.0000 mg | ORAL_TABLET | Freq: Once | ORAL | 1 refills | Status: AC

## 2016-10-01 NOTE — Progress Notes (Signed)
Name: Marie Martin   MRN: [id number]    DOB: 12-Jul-1931   Date:10/01/2016       Progress Note  Subjective  Chief Complaint  Chief Complaint  Patient presents with  . Vaginitis    had 3 days of antibiotic left over that she took recently- now has a yeast infection- needs diflucan    Urinary Frequency   This is a new problem. The current episode started 1 to 4 weeks ago. The problem occurs intermittently. The problem has been waxing and waning. The quality of the pain is described as burning. The pain is at a severity of 5/10. The pain is moderate. There has been no fever. She is not sexually active. There is no history of pyelonephritis. Associated symptoms include frequency. Pertinent negatives include no chills, hematuria, nausea, sweats or urgency.    No problem-specific Assessment & Plan notes found for this encounter.   Past Medical History:  Diagnosis Date  . COPD (chronic obstructive pulmonary disease) (HCC)   . Depression     Past Surgical History:  Procedure Laterality Date  . TONSILLECTOMY      History reviewed. No pertinent family history.  Social History   Social History  . Marital status: Widowed    Spouse name: N/A  . Number of children: N/A  . Years of education: N/A   Occupational History  . Not on file.   Social History Main Topics  . Smoking status: Former Games developer  . Smokeless tobacco: Never Used  . Alcohol use No  . Drug use: No  . Sexual activity: Not on file   Other Topics Concern  . Not on file   Social History Narrative  . No narrative on file    Allergies  Allergen Reactions  . Amoxicillin-Pot Clavulanate Nausea Only and Other (See Comments)    Other Reaction: trouble swallowing  . Penicillins     Outpatient Medications Prior to Visit  Medication Sig Dispense Refill  . albuterol (PROAIR HFA) 108 (90 Base) MCG/ACT inhaler Inhale 1-2 puffs into the lungs every 6 (six) hours as needed for wheezing or shortness of breath.    .  Fluticasone-Salmeterol (ADVAIR) 250-50 MCG/DOSE AEPB Inhale 1 puff into the lungs 2 (two) times daily. 3 each 2  . sertraline (ZOLOFT) 100 MG tablet Take 1 tablet (100 mg total) by mouth daily. 90 tablet 2  . Dextromethorphan-Guaifenesin (MUCINEX DM MAXIMUM STRENGTH PO) Take by mouth.    Marland Kitchen azithromycin (ZITHROMAX) 250 MG tablet 2 today then 1 a day for 4 days 6 tablet 1  . doxycycline (VIBRA-TABS) 100 MG tablet Take 1 tablet (100 mg total) by mouth 2 (two) times daily. 20 tablet 0  . guaiFENesin-codeine (ROBITUSSIN AC) 100-10 MG/5ML syrup Take 5 mLs by mouth 3 (three) times daily as needed for cough. 150 mL 0   No facility-administered medications prior to visit.     Review of Systems  Constitutional: Negative for chills, fever, malaise/fatigue and weight loss.  HENT: Negative for ear discharge, ear pain and sore throat.   Eyes: Negative for blurred vision.  Respiratory: Negative for cough, sputum production, shortness of breath and wheezing.   Cardiovascular: Negative for chest pain, palpitations and leg swelling.  Gastrointestinal: Negative for abdominal pain, blood in stool, constipation, diarrhea, heartburn, melena and nausea.  Genitourinary: Positive for frequency. Negative for dysuria, hematuria and urgency.  Musculoskeletal: Negative for back pain, joint pain, myalgias and neck pain.  Skin: Negative for rash.  Neurological: Negative for dizziness, tingling,  sensory change, focal weakness and headaches.  Endo/Heme/Allergies: Negative for environmental allergies and polydipsia. Does not bruise/bleed easily.  Psychiatric/Behavioral: Negative for depression and suicidal ideas. The patient is not nervous/anxious and does not have insomnia.      Objective  Vitals:   10/01/16 1535  BP: 120/68  Pulse: 64  Weight: 144 lb (65.3 kg)  Height: 5\' 6"  (1.676 m)    Physical Exam  Constitutional: She is well-developed, well-nourished, and in no distress. No distress.  HENT:  Head:  Normocephalic and atraumatic.  Right Ear: External ear normal.  Left Ear: External ear normal.  Nose: Nose normal.  Mouth/Throat: Oropharynx is clear and moist.  Eyes: Pupils are equal, round, and reactive to light. Conjunctivae and EOM are normal. Right eye exhibits no discharge. Left eye exhibits no discharge.  Neck: Normal range of motion. Neck supple. No JVD present. No thyromegaly present.  Cardiovascular: Normal rate, regular rhythm, normal heart sounds and intact distal pulses.  Exam reveals no gallop and no friction rub.   No murmur heard. Pulmonary/Chest: Effort normal and breath sounds normal.  Abdominal: Soft. Bowel sounds are normal. She exhibits no mass. There is no tenderness. There is no guarding.  Musculoskeletal: Normal range of motion. She exhibits no edema.  Lymphadenopathy:    She has no cervical adenopathy.  Neurological: She is alert. She has normal reflexes.  Skin: Skin is warm and dry. She is not diaphoretic.  Psychiatric: Mood and affect normal.  Nursing note and vitals reviewed.     Assessment & Plan  Problem List Items Addressed This Visit      Other   Recurrent major depressive episodes (HCC)    Other Visit Diagnoses    Vulvovaginitis due to yeast    -  Primary   Relevant Medications   fluconazole (DIFLUCAN) 150 MG tablet   nystatin cream (MYCOSTATIN)   Need for vaccination against Streptococcus pneumoniae using pneumococcal conjugate vaccine 13       Relevant Orders   Pneumococcal conjugate vaccine 13-valent (Completed)   Influenza vaccine needed       Relevant Orders   Flu Vaccine QUAD 6+ mos PF IM (Fluarix Quad PF) (Completed)      Meds ordered this encounter  Medications  . fluconazole (DIFLUCAN) 150 MG tablet    Sig: Take 1 tablet (150 mg total) by mouth once.    Dispense:  2 tablet    Refill:  1  . nystatin cream (MYCOSTATIN)    Sig: Apply 1 application topically 2 (two) times daily.    Dispense:  30 g    Refill:  0      Dr.  [facility]  10/01/16

## 2016-11-10 ENCOUNTER — Ambulatory Visit (INDEPENDENT_AMBULATORY_CARE_PROVIDER_SITE_OTHER): Payer: Medicare Other | Admitting: Family Medicine

## 2016-11-10 ENCOUNTER — Encounter: Payer: Self-pay | Admitting: Family Medicine

## 2016-11-10 VITALS — BP 118/70 | HR 51 | Resp 16 | Ht 66.0 in | Wt 142.0 lb

## 2016-11-10 DIAGNOSIS — F339 Major depressive disorder, recurrent, unspecified: Secondary | ICD-10-CM

## 2016-11-10 DIAGNOSIS — J432 Centrilobular emphysema: Secondary | ICD-10-CM | POA: Diagnosis not present

## 2016-11-10 MED ORDER — SERTRALINE HCL 100 MG PO TABS: 100.0000 mg | ORAL_TABLET | Freq: Every day | ORAL | 2 refills | Status: DC

## 2016-11-10 MED ORDER — FLUTICASONE-SALMETEROL 250-50 MCG/DOSE IN AEPB: 1.0000 | INHALATION_SPRAY | Freq: Two times a day (BID) | RESPIRATORY_TRACT | 2 refills | Status: DC

## 2016-11-10 MED ORDER — ALBUTEROL SULFATE HFA 108 (90 BASE) MCG/ACT IN AERS: 1.0000 | INHALATION_SPRAY | Freq: Four times a day (QID) | RESPIRATORY_TRACT | 2 refills | Status: DC | PRN

## 2016-11-10 NOTE — Progress Notes (Signed)
Name: Marie Martin   MRN: [id number]    DOB: [birth year]   Date:11/10/2016       Progress Note  Subjective  Chief Complaint  Chief Complaint  Patient presents with  . Depression    Follow Up -refills.Wants some fluconazole incase she needs it in [place].  Wants some Doxy to take to [place] while away for awhile. She will not return until January.     Depression         This is a chronic problem.  The current episode started 1 to 4 weeks ago.   The problem has been gradually improving since onset.  Associated symptoms include no decreased concentration, no fatigue, no helplessness, no hopelessness, does not have insomnia, not irritable, no restlessness, no decreased interest, no appetite change, no body aches, no myalgias, no headaches, no indigestion, not sad and no suicidal ideas.  Past treatments include SSRIs - Selective serotonin reuptake inhibitors.  Compliance with treatment is good.  Previous treatment provided mild relief. Shortness of Breath  This is a chronic problem. The current episode started more than 1 year ago. The problem has been waxing and waning. Pertinent negatives include no abdominal pain, chest pain, claudication, coryza, ear pain, fever, headaches, hemoptysis, leg pain, leg swelling, neck pain, orthopnea, PND, rash, rhinorrhea, sore throat, sputum production, swollen glands, syncope, vomiting or wheezing. The symptoms are aggravated by pollens. She has tried beta agonist inhalers for the symptoms. The treatment provided significant relief. There is no history of allergies, aspirin allergies, asthma, bronchiolitis, CAD, chronic lung disease, COPD, DVT, a heart failure, PE, pneumonia or a recent surgery.    No problem-specific Assessment & Plan notes found for this encounter.   Past Medical History:  Diagnosis Date  . COPD (chronic obstructive pulmonary disease) (HCC)   . Depression     Past Surgical History:  Procedure Laterality Date  . TONSILLECTOMY       History reviewed. No pertinent family history.  Social History   Social History  . Marital status: Widowed    Spouse name: N/A  . Number of children: N/A  . Years of education: N/A   Occupational History  . Not on file.   Social History Main Topics  . Smoking status: Former Games developer  . Smokeless tobacco: Never Used  . Alcohol use No  . Drug use: No  . Sexual activity: Not on file   Other Topics Concern  . Not on file   Social History Narrative  . No narrative on file    Allergies  Allergen Reactions  . Amoxicillin-Pot Clavulanate Nausea Only and Other (See Comments)    Other Reaction: trouble swallowing  . Penicillins     Outpatient Medications Prior to Visit  Medication Sig Dispense Refill  . albuterol (PROAIR HFA) 108 (90 Base) MCG/ACT inhaler Inhale 1-2 puffs into the lungs every 6 (six) hours as needed for wheezing or shortness of breath.    . Fluticasone-Salmeterol (ADVAIR) 250-50 MCG/DOSE AEPB Inhale 1 puff into the lungs 2 (two) times daily. 3 each 2  . sertraline (ZOLOFT) 100 MG tablet Take 1 tablet (100 mg total) by mouth daily. 90 tablet 2  . nystatin cream (MYCOSTATIN) Apply 1 application topically 2 (two) times daily. (Patient not taking: Reported on 11/10/2016) 30 g 0   No facility-administered medications prior to visit.     Review of Systems  Constitutional: Negative for appetite change, chills, fatigue, fever, malaise/fatigue and weight loss.  HENT: Negative for ear discharge, ear pain,  rhinorrhea and sore throat.   Eyes: Negative for blurred vision.  Respiratory: Positive for shortness of breath. Negative for cough, hemoptysis, sputum production and wheezing.   Cardiovascular: Negative for chest pain, palpitations, orthopnea, claudication, leg swelling, syncope and PND.  Gastrointestinal: Negative for abdominal pain, blood in stool, constipation, diarrhea, heartburn, melena, nausea and vomiting.  Genitourinary: Negative for dysuria, frequency,  hematuria and urgency.  Musculoskeletal: Negative for back pain, joint pain, myalgias and neck pain.  Skin: Negative for rash.  Neurological: Negative for dizziness, tingling, sensory change, focal weakness and headaches.  Endo/Heme/Allergies: Negative for environmental allergies and polydipsia. Does not bruise/bleed easily.  Psychiatric/Behavioral: Positive for depression. Negative for decreased concentration and suicidal ideas. The patient is not nervous/anxious and does not have insomnia.      Objective  Vitals:   11/10/16 1104  BP: 118/70  Pulse: (!) 51  Resp: 16  SpO2: 94%  Weight: 142 lb (64.4 kg)  Height:  (1.676 m)    Physical Exam  Constitutional: She is well-developed, well-nourished, and in no distress. She is not irritable. No distress.  HENT:  Head: Normocephalic and atraumatic.  Right Ear: External ear normal.  Left Ear: External ear normal.  Nose: Nose normal.  Mouth/Throat: Oropharynx is clear and moist.  Eyes: Pupils are equal, round, and reactive to light. Conjunctivae and EOM are normal. Right eye exhibits no discharge. Left eye exhibits no discharge.  Neck: Normal range of motion. Neck supple. No JVD present. No thyromegaly present.  Cardiovascular: Normal rate, regular rhythm, normal heart sounds and intact distal pulses.  Exam reveals no gallop and no friction rub.   No murmur heard. Pulmonary/Chest: Effort normal and breath sounds normal. She has no wheezes. She has no rales.  Abdominal: Soft. Bowel sounds are normal. She exhibits no mass. There is no tenderness. There is no guarding.  Musculoskeletal: Normal range of motion. She exhibits no edema.  Lymphadenopathy:    She has no cervical adenopathy.  Neurological: She is alert. She has normal reflexes.  Skin: Skin is warm and dry. She is not diaphoretic.  Psychiatric: Mood and affect normal.  Nursing note and vitals reviewed.     Assessment & Plan  Problem List Items Addressed This Visit       Respiratory   Centriacinar emphysema (HCC) - Primary   Relevant Medications   albuterol (PROAIR HFA) 108 (90 Base) MCG/ACT inhaler   Fluticasone-Salmeterol (ADVAIR) 250-50 MCG/DOSE AEPB     Other   Recurrent major depressive episodes (HCC)   Relevant Medications   sertraline (ZOLOFT) 100 MG tablet      Meds ordered this encounter  Medications  . sertraline (ZOLOFT) 100 MG tablet    Sig: Take 1 tablet (100 mg total) by mouth daily.    Dispense:  90 tablet    Refill:  2  . albuterol (PROAIR HFA) 108 (90 Base) MCG/ACT inhaler    Sig: Inhale 1-2 puffs into the lungs every 6 (six) hours as needed for wheezing or shortness of breath.    Dispense:  3 Inhaler    Refill:  2  . Fluticasone-Salmeterol (ADVAIR) 250-50 MCG/DOSE AEPB    Sig: Inhale 1 puff into the lungs 2 (two) times daily.    Dispense:  3 each    Refill:  2      Dr. [facility]  11/10/16

## 2016-12-18 DIAGNOSIS — S51812A Laceration without foreign body of left forearm, initial encounter: Secondary | ICD-10-CM | POA: Diagnosis not present

## 2016-12-22 DIAGNOSIS — J019 Acute sinusitis, unspecified: Secondary | ICD-10-CM | POA: Diagnosis not present

## 2016-12-22 DIAGNOSIS — J209 Acute bronchitis, unspecified: Secondary | ICD-10-CM | POA: Diagnosis not present

## 2017-02-17 ENCOUNTER — Telehealth: Payer: Self-pay | Admitting: Family Medicine

## 2017-02-17 NOTE — Telephone Encounter (Signed)
Confirming insurance information for awv if she has bcbs please cancel with ammie and re-schedule with dr. Yetta BarreJones. knb

## 2017-02-22 ENCOUNTER — Ambulatory Visit: Payer: Medicare Other

## 2017-02-22 DIAGNOSIS — J441 Chronic obstructive pulmonary disease with (acute) exacerbation: Secondary | ICD-10-CM | POA: Insufficient documentation

## 2017-02-22 DIAGNOSIS — R509 Fever, unspecified: Secondary | ICD-10-CM | POA: Diagnosis not present

## 2017-02-22 DIAGNOSIS — R0602 Shortness of breath: Secondary | ICD-10-CM | POA: Insufficient documentation

## 2017-02-22 DIAGNOSIS — R062 Wheezing: Secondary | ICD-10-CM | POA: Diagnosis not present

## 2017-02-22 DIAGNOSIS — Z87891 Personal history of nicotine dependence: Secondary | ICD-10-CM | POA: Diagnosis not present

## 2017-02-22 DIAGNOSIS — R05 Cough: Secondary | ICD-10-CM | POA: Diagnosis not present

## 2017-02-22 DIAGNOSIS — F419 Anxiety disorder, unspecified: Secondary | ICD-10-CM | POA: Diagnosis not present

## 2017-02-22 DIAGNOSIS — R918 Other nonspecific abnormal finding of lung field: Secondary | ICD-10-CM | POA: Diagnosis not present

## 2017-02-22 DIAGNOSIS — R000 Tachycardia, unspecified: Secondary | ICD-10-CM | POA: Diagnosis not present

## 2017-02-22 DIAGNOSIS — F329 Major depressive disorder, single episode, unspecified: Secondary | ICD-10-CM | POA: Diagnosis not present

## 2017-02-22 DIAGNOSIS — R739 Hyperglycemia, unspecified: Secondary | ICD-10-CM | POA: Diagnosis not present

## 2017-02-22 DIAGNOSIS — B348 Other viral infections of unspecified site: Secondary | ICD-10-CM | POA: Diagnosis not present

## 2017-02-22 DIAGNOSIS — R059 Cough, unspecified: Secondary | ICD-10-CM | POA: Insufficient documentation

## 2017-02-22 DIAGNOSIS — R0781 Pleurodynia: Secondary | ICD-10-CM | POA: Diagnosis not present

## 2017-02-22 DIAGNOSIS — Z7951 Long term (current) use of inhaled steroids: Secondary | ICD-10-CM | POA: Diagnosis not present

## 2017-02-22 DIAGNOSIS — F32A Depression, unspecified: Secondary | ICD-10-CM | POA: Insufficient documentation

## 2017-02-23 DIAGNOSIS — F329 Major depressive disorder, single episode, unspecified: Secondary | ICD-10-CM | POA: Diagnosis not present

## 2017-02-23 DIAGNOSIS — J441 Chronic obstructive pulmonary disease with (acute) exacerbation: Secondary | ICD-10-CM | POA: Diagnosis not present

## 2017-02-23 MED ORDER — ONDANSETRON 4 MG PO TBDP
4.00 mg | ORAL_TABLET | ORAL | Status: DC
Start: ? — End: 2017-02-23

## 2017-02-23 MED ORDER — GENERIC EXTERNAL MEDICATION
1.00 | Status: DC
Start: 2017-02-23 — End: 2017-02-23

## 2017-02-23 MED ORDER — ALBUTEROL SULFATE (2.5 MG/3ML) 0.083% IN NEBU
2.50 mg | INHALATION_SOLUTION | RESPIRATORY_TRACT | Status: DC
Start: 2017-02-23 — End: 2017-02-23

## 2017-02-23 MED ORDER — GUAIFENESIN 100 MG/5ML PO SYRP
200.00 mg | ORAL_SOLUTION | ORAL | Status: DC
Start: ? — End: 2017-02-23

## 2017-02-23 MED ORDER — ACETAMINOPHEN 325 MG PO TABS
650.00 mg | ORAL_TABLET | ORAL | Status: DC
Start: ? — End: 2017-02-23

## 2017-02-23 MED ORDER — SERTRALINE HCL 100 MG PO TABS
100.00 mg | ORAL_TABLET | ORAL | Status: DC
Start: 2017-02-24 — End: 2017-02-23

## 2017-02-23 MED ORDER — LEVOFLOXACIN 500 MG PO TABS
500.00 mg | ORAL_TABLET | ORAL | Status: DC
Start: 2017-02-24 — End: 2017-02-23

## 2017-02-23 MED ORDER — SENNOSIDES 8.6 MG PO TABS
2.00 | ORAL_TABLET | ORAL | Status: DC
Start: ? — End: 2017-02-23

## 2017-02-23 MED ORDER — GENERIC EXTERNAL MEDICATION
40.00 mg | Status: DC
Start: 2017-02-24 — End: 2017-02-23

## 2017-02-23 MED ORDER — POLYETHYLENE GLYCOL 3350 17 G PO PACK
17.00 | PACK | ORAL | Status: DC
Start: ? — End: 2017-02-23

## 2017-02-23 MED ORDER — IPRATROPIUM BROMIDE 0.02 % IN SOLN
500.00 | RESPIRATORY_TRACT | Status: DC
Start: 2017-02-23 — End: 2017-02-23

## 2017-02-25 ENCOUNTER — Other Ambulatory Visit: Payer: Self-pay

## 2017-03-01 ENCOUNTER — Encounter: Payer: Self-pay | Admitting: Family Medicine

## 2017-03-01 ENCOUNTER — Ambulatory Visit (INDEPENDENT_AMBULATORY_CARE_PROVIDER_SITE_OTHER): Payer: Medicare Other | Admitting: Family Medicine

## 2017-03-01 VITALS — BP 120/80 | HR 64 | Ht 66.0 in | Wt 146.0 lb

## 2017-03-01 DIAGNOSIS — J432 Centrilobular emphysema: Secondary | ICD-10-CM | POA: Diagnosis not present

## 2017-03-01 DIAGNOSIS — J441 Chronic obstructive pulmonary disease with (acute) exacerbation: Secondary | ICD-10-CM | POA: Diagnosis not present

## 2017-03-01 DIAGNOSIS — Z09 Encounter for follow-up examination after completed treatment for conditions other than malignant neoplasm: Secondary | ICD-10-CM

## 2017-03-01 MED ORDER — ALBUTEROL SULFATE HFA 108 (90 BASE) MCG/ACT IN AERS: 1.0000 | INHALATION_SPRAY | Freq: Four times a day (QID) | RESPIRATORY_TRACT | 2 refills | Status: DC | PRN

## 2017-03-01 MED ORDER — ALBUTEROL SULFATE (2.5 MG/3ML) 0.083% IN NEBU: 2.5000 mg | INHALATION_SOLUTION | Freq: Four times a day (QID) | RESPIRATORY_TRACT | 12 refills | Status: DC | PRN

## 2017-03-01 MED ORDER — FLUTICASONE-SALMETEROL 250-50 MCG/DOSE IN AEPB: 1.0000 | INHALATION_SPRAY | Freq: Two times a day (BID) | RESPIRATORY_TRACT | 2 refills | Status: DC

## 2017-03-01 MED ORDER — PREDNISONE 10 MG PO TABS
10.0000 mg | ORAL_TABLET | Freq: Every day | ORAL | 0 refills | Status: DC
Start: 2017-03-01 — End: 2017-05-24

## 2017-03-01 NOTE — Patient Instructions (Signed)

## 2017-03-01 NOTE — Progress Notes (Signed)
Name: Marie Martin   MRN: [id number]    DOB: 04-Jun-1931   Date:03/01/2017       Progress Note  Subjective  Chief Complaint  Chief Complaint  Patient presents with  . Follow-up    needs O2 at home    Shortness of Breath  This is a chronic problem. The current episode started 1 to 4 weeks ago. The problem occurs intermittently. The problem has been gradually worsening. Associated symptoms include a fever. Pertinent negatives include no abdominal pain, chest pain, claudication, coryza, ear pain, headaches, hemoptysis, leg pain, leg swelling, neck pain, orthopnea, PND, rash, rhinorrhea, sore throat, sputum production, swollen glands, syncope, vomiting or wheezing. The symptoms are aggravated by URIs and weather changes. She has tried steroid inhalers, oral steroids and beta agonist inhalers for the symptoms. The treatment provided moderate relief. Her past medical history is significant for bronchiolitis and COPD. There is no history of allergies, aspirin allergies, asthma, CAD, DVT, a heart failure, PE, pneumonia or a recent surgery.    No problem-specific Assessment & Plan notes found for this encounter.   Past Medical History:  Diagnosis Date  . COPD (chronic obstructive pulmonary disease) (HCC)   . Depression     Past Surgical History:  Procedure Laterality Date  . TONSILLECTOMY      No family history on file.  Social History   Socioeconomic History  . Marital status: Widowed    Spouse name: Not on file  . Number of children: Not on file  . Years of education: Not on file  . Highest education level: Not on file  Social Needs  . Financial resource strain: Not on file  . Food insecurity - worry: Not on file  . Food insecurity - inability: Not on file  . Transportation needs - medical: Not on file  . Transportation needs - non-medical: Not on file  Occupational History  . Not on file  Tobacco Use  . Smoking status: Former Games developermoker  . Smokeless tobacco: Never Used   Substance and Sexual Activity  . Alcohol use: No    Alcohol/week: 0.0 oz  . Drug use: No  . Sexual activity: Not on file  Other Topics Concern  . Not on file  Social History Narrative  . Not on file    Allergies  Allergen Reactions  . Amoxicillin-Pot Clavulanate Nausea Only and Other (See Comments)    Other Reaction: trouble swallowing  . Penicillins     Outpatient Medications Prior to Visit  Medication Sig Dispense Refill  . sertraline (ZOLOFT) 100 MG tablet Take 1 tablet (100 mg total) by mouth daily. 90 tablet 2  . albuterol (PROAIR HFA) 108 (90 Base) MCG/ACT inhaler Inhale 1-2 puffs into the lungs every 6 (six) hours as needed for wheezing or shortness of breath. 3 Inhaler 2  . Fluticasone-Salmeterol (ADVAIR) 250-50 MCG/DOSE AEPB Inhale 1 puff into the lungs 2 (two) times daily. 3 each 2  . nystatin cream (MYCOSTATIN) Apply 1 application topically 2 (two) times daily. (Patient not taking: Reported on 11/10/2016) 30 g 0   No facility-administered medications prior to visit.     Review of Systems  Constitutional: Positive for fever. Negative for chills, malaise/fatigue and weight loss.  HENT: Negative for ear discharge, ear pain, rhinorrhea and sore throat.   Eyes: Negative for blurred vision.  Respiratory: Positive for shortness of breath. Negative for cough, hemoptysis, sputum production and wheezing.   Cardiovascular: Negative for chest pain, palpitations, orthopnea, claudication, leg swelling, syncope and  PND.  Gastrointestinal: Negative for abdominal pain, blood in stool, constipation, diarrhea, heartburn, melena, nausea and vomiting.  Genitourinary: Negative for dysuria, frequency, hematuria and urgency.  Musculoskeletal: Negative for back pain, joint pain, myalgias and neck pain.  Skin: Negative for rash.  Neurological: Negative for dizziness, tingling, sensory change, focal weakness and headaches.  Endo/Heme/Allergies: Negative for environmental allergies and  polydipsia. Does not bruise/bleed easily.  Psychiatric/Behavioral: Negative for depression and suicidal ideas. The patient is not nervous/anxious and does not have insomnia.      Objective  Vitals:   03/01/17 1116  BP: 120/80  Pulse: 64  SpO2: 92%  Weight: 146 lb (66.2 kg)  Height: 5\' 6"  (1.676 m)    Physical Exam  Constitutional: She is well-developed, well-nourished, and in no distress. No distress.  HENT:  Head: Normocephalic and atraumatic.  Right Ear: External ear normal.  Left Ear: External ear normal.  Nose: Nose normal.  Mouth/Throat: Oropharynx is clear and moist.  Eyes: Conjunctivae and EOM are normal. Pupils are equal, round, and reactive to light. Right eye exhibits no discharge. Left eye exhibits no discharge.  Neck: Normal range of motion. Neck supple. No JVD present. No thyromegaly present.  Cardiovascular: Normal rate, regular rhythm, normal heart sounds and intact distal pulses. Exam reveals no gallop and no friction rub.  No murmur heard. Pulmonary/Chest: Effort normal. She has wheezes. She has no rales.  Abdominal: Soft. Bowel sounds are normal. She exhibits no mass. There is no tenderness. There is no guarding.  Musculoskeletal: Normal range of motion. She exhibits no edema.  Lymphadenopathy:    She has no cervical adenopathy.  Neurological: She is alert.  Skin: Skin is warm and dry. She is not diaphoretic.  Psychiatric: Mood and affect normal.  Nursing note and vitals reviewed.     Assessment & Plan  Problem List Items Addressed This Visit      Respiratory   Centriacinar emphysema (HCC)   Relevant Medications   albuterol (PROVENTIL) (2.5 MG/3ML) 0.083% nebulizer solution   Fluticasone-Salmeterol (ADVAIR) 250-50 MCG/DOSE AEPB   albuterol (PROAIR HFA) 108 (90 Base) MCG/ACT inhaler   predniSONE (DELTASONE) 10 MG tablet   Other Relevant Orders   Ambulatory referral to Pulmonology    Other Visit Diagnoses    Hospital discharge follow-up    -   Primary   COPD exacerbation (HCC)       Relevant Medications   albuterol (PROVENTIL) (2.5 MG/3ML) 0.083% nebulizer solution   Fluticasone-Salmeterol (ADVAIR) 250-50 MCG/DOSE AEPB   albuterol (PROAIR HFA) 108 (90 Base) MCG/ACT inhaler   predniSONE (DELTASONE) 10 MG tablet   Other Relevant Orders   Ambulatory referral to Pulmonology      Meds ordered this encounter  Medications  . albuterol (PROVENTIL) (2.5 MG/3ML) 0.083% nebulizer solution    Sig: Take 3 mLs (2.5 mg total) by nebulization every 6 (six) hours as needed for wheezing or shortness of breath.    Dispense:  75 mL    Refill:  12  . Fluticasone-Salmeterol (ADVAIR) 250-50 MCG/DOSE AEPB    Sig: Inhale 1 puff into the lungs 2 (two) times daily.    Dispense:  3 each    Refill:  2  . albuterol (PROAIR HFA) 108 (90 Base) MCG/ACT inhaler    Sig: Inhale 1-2 puffs into the lungs every 6 (six) hours as needed for wheezing or shortness of breath.    Dispense:  3 Inhaler    Refill:  2  . predniSONE (DELTASONE) 10 MG tablet  Sig: Take 1 tablet (10 mg total) by mouth daily with breakfast.    Dispense:  30 tablet    Refill:  0      Dr. [facility]den [facility]  03/01/17

## 2017-03-05 ENCOUNTER — Inpatient Hospital Stay: Payer: Medicare Other | Admitting: Family Medicine

## 2017-03-08 DIAGNOSIS — J432 Centrilobular emphysema: Secondary | ICD-10-CM | POA: Diagnosis not present

## 2017-03-08 DIAGNOSIS — R05 Cough: Secondary | ICD-10-CM | POA: Diagnosis not present

## 2017-03-08 DIAGNOSIS — R0902 Hypoxemia: Secondary | ICD-10-CM | POA: Diagnosis not present

## 2017-03-08 DIAGNOSIS — R0609 Other forms of dyspnea: Secondary | ICD-10-CM | POA: Diagnosis not present

## 2017-03-12 DIAGNOSIS — J449 Chronic obstructive pulmonary disease, unspecified: Secondary | ICD-10-CM | POA: Diagnosis not present

## 2017-03-30 ENCOUNTER — Other Ambulatory Visit: Payer: Self-pay

## 2017-03-30 DIAGNOSIS — J441 Chronic obstructive pulmonary disease with (acute) exacerbation: Secondary | ICD-10-CM

## 2017-03-30 MED ORDER — ALBUTEROL SULFATE HFA 108 (90 BASE) MCG/ACT IN AERS: 2.0000 | INHALATION_SPRAY | Freq: Four times a day (QID) | RESPIRATORY_TRACT | 0 refills | Status: DC | PRN

## 2017-04-06 ENCOUNTER — Other Ambulatory Visit: Payer: Self-pay

## 2017-04-06 DIAGNOSIS — J441 Chronic obstructive pulmonary disease with (acute) exacerbation: Secondary | ICD-10-CM

## 2017-04-06 MED ORDER — ALBUTEROL SULFATE HFA 108 (90 BASE) MCG/ACT IN AERS: 2.0000 | INHALATION_SPRAY | Freq: Four times a day (QID) | RESPIRATORY_TRACT | 5 refills | Status: DC | PRN

## 2017-04-09 DIAGNOSIS — J449 Chronic obstructive pulmonary disease, unspecified: Secondary | ICD-10-CM | POA: Diagnosis not present

## 2017-04-14 ENCOUNTER — Other Ambulatory Visit: Payer: Self-pay

## 2017-04-26 ENCOUNTER — Encounter: Payer: Self-pay | Admitting: Family Medicine

## 2017-04-26 ENCOUNTER — Ambulatory Visit: Payer: Medicare Other | Admitting: Family Medicine

## 2017-04-26 VITALS — BP 120/68 | HR 68 | Ht 66.0 in | Wt 144.0 lb

## 2017-04-26 DIAGNOSIS — J432 Centrilobular emphysema: Secondary | ICD-10-CM | POA: Diagnosis not present

## 2017-04-26 DIAGNOSIS — J0100 Acute maxillary sinusitis, unspecified: Secondary | ICD-10-CM | POA: Diagnosis not present

## 2017-04-26 DIAGNOSIS — J40 Bronchitis, not specified as acute or chronic: Secondary | ICD-10-CM | POA: Diagnosis not present

## 2017-04-26 MED ORDER — AZITHROMYCIN 250 MG PO TABS: ORAL_TABLET | ORAL | 0 refills | Status: DC

## 2017-04-26 NOTE — Progress Notes (Signed)
Name: Marie Martin   MRN: [id number]    DOB: 19-Feb-1931   Date:04/26/2017       Progress Note  Subjective  Chief Complaint  Chief Complaint  Patient presents with  . Sinusitis    yellow production/ cough and cong- finished round of prednisone 1 week ago    Sinusitis  This is a new problem. The current episode started in the past 7 days. The problem is unchanged. The pain is mild. Associated symptoms include chills, congestion, coughing, shortness of breath, sinus pressure, sneezing and a sore throat. Pertinent negatives include no diaphoresis, ear pain, headaches or neck pain. Past treatments include nothing (oxygen extractor). The treatment provided no relief.  Cough  This is a new problem. The current episode started in the past 7 days. The problem has been waxing and waning. The cough is productive of purulent sputum (yellow). Associated symptoms include chills, nasal congestion, postnasal drip, a sore throat and shortness of breath. Pertinent negatives include no chest pain, ear pain, fever, headaches, heartburn, hemoptysis, myalgias, rash, weight loss or wheezing. There is no history of environmental allergies.    No problem-specific Assessment & Plan notes found for this encounter.   Past Medical History:  Diagnosis Date  . COPD (chronic obstructive pulmonary disease) (HCC)   . Depression     Past Surgical History:  Procedure Laterality Date  . TONSILLECTOMY      History reviewed. No pertinent family history.  Social History   Socioeconomic History  . Marital status: Widowed    Spouse name: Not on file  . Number of children: Not on file  . Years of education: Not on file  . Highest education level: Not on file  Social Needs  . Financial resource strain: Not on file  . Food insecurity - worry: Not on file  . Food insecurity - inability: Not on file  . Transportation needs - medical: Not on file  . Transportation needs - non-medical: Not on file  Occupational  History  . Not on file  Tobacco Use  . Smoking status: Former Games developer  . Smokeless tobacco: Never Used  Substance and Sexual Activity  . Alcohol use: No    Alcohol/week: 0.0 oz  . Drug use: No  . Sexual activity: Not on file  Other Topics Concern  . Not on file  Social History Narrative  . Not on file    Allergies  Allergen Reactions  . Amoxicillin-Pot Clavulanate Nausea Only and Other (See Comments)    Other Reaction: trouble swallowing  . Penicillins     Outpatient Medications Prior to Visit  Medication Sig Dispense Refill  . albuterol (PROVENTIL HFA;VENTOLIN HFA) 108 (90 Base) MCG/ACT inhaler Inhale 2 puffs into the lungs every 6 (six) hours as needed for wheezing or shortness of breath. 1 Inhaler 5  . albuterol (PROVENTIL) (2.5 MG/3ML) 0.083% nebulizer solution Take 3 mLs (2.5 mg total) by nebulization every 6 (six) hours as needed for wheezing or shortness of breath. 75 mL 12  . sertraline (ZOLOFT) 100 MG tablet Take 1 tablet (100 mg total) by mouth daily. 90 tablet 2  . nystatin cream (MYCOSTATIN) Apply 1 application topically 2 (two) times daily. (Patient not taking: Reported on 11/10/2016) 30 g 0  . predniSONE (DELTASONE) 10 MG tablet Take 1 tablet (10 mg total) by mouth daily with breakfast. (Patient not taking: Reported on 04/26/2017) 30 tablet 0   No facility-administered medications prior to visit.     Review of Systems  Constitutional:  Positive for chills. Negative for diaphoresis, fever, malaise/fatigue and weight loss.  HENT: Positive for congestion, postnasal drip, sinus pressure, sneezing and sore throat. Negative for ear discharge and ear pain.   Eyes: Negative for blurred vision.  Respiratory: Positive for cough and shortness of breath. Negative for hemoptysis, sputum production and wheezing.   Cardiovascular: Negative for chest pain, palpitations and leg swelling.  Gastrointestinal: Negative for abdominal pain, blood in stool, constipation, diarrhea,  heartburn, melena and nausea.  Genitourinary: Negative for dysuria, frequency, hematuria and urgency.  Musculoskeletal: Negative for back pain, joint pain, myalgias and neck pain.  Skin: Negative for rash.  Neurological: Negative for dizziness, tingling, sensory change, focal weakness and headaches.  Endo/Heme/Allergies: Negative for environmental allergies and polydipsia. Does not bruise/bleed easily.  Psychiatric/Behavioral: Negative for depression and suicidal ideas. The patient is not nervous/anxious and does not have insomnia.      Objective  Vitals:   04/26/17 1420  BP: 120/68  Pulse: 68  SpO2: 98%  Weight: 144 lb (65.3 kg)  Height: 5\' 6"  (1.676 m)    Physical Exam  Constitutional: She is well-developed, well-nourished, and in no distress. No distress.  HENT:  Head: Normocephalic and atraumatic.  Right Ear: Tympanic membrane, external ear and ear canal normal.  Left Ear: Tympanic membrane, external ear and ear canal normal.  Nose: Nose normal.  Mouth/Throat: Oropharynx is clear and moist. No oropharyngeal exudate, posterior oropharyngeal edema or posterior oropharyngeal erythema.  Eyes: Conjunctivae and EOM are normal. Pupils are equal, round, and reactive to light. Right eye exhibits no discharge. Left eye exhibits no discharge.  Neck: Normal range of motion. Neck supple. No JVD present. No thyromegaly present.  Cardiovascular: Normal rate, regular rhythm, normal heart sounds and intact distal pulses. Exam reveals no gallop and no friction rub.  No murmur heard. Pulmonary/Chest: Effort normal and breath sounds normal. She has no wheezes. She has no rales.  Abdominal: Soft. Bowel sounds are normal. She exhibits no mass. There is no tenderness. There is no guarding.  Musculoskeletal: Normal range of motion. She exhibits no edema.  Lymphadenopathy:    She has no cervical adenopathy.  Neurological: She is alert. She has normal reflexes.  Skin: Skin is warm and dry. She is  not diaphoretic.  Psychiatric: Mood and affect normal.  Nursing note and vitals reviewed.     Assessment & Plan  Problem List Items Addressed This Visit      Respiratory   Centriacinar emphysema (HCC)   Relevant Medications   azithromycin (ZITHROMAX) 250 MG tablet    Other Visit Diagnoses    Acute maxillary sinusitis, recurrence not specified    -  Primary   Relevant Medications   azithromycin (ZITHROMAX) 250 MG tablet   Bronchitis          Meds ordered this encounter  Medications  . azithromycin (ZITHROMAX) 250 MG tablet    Sig: 2 today then 1 a day for 4 days    Dispense:  6 tablet    Refill:  0      Dr. Hayden [facility]  04/26/17

## 2017-04-29 DIAGNOSIS — J449 Chronic obstructive pulmonary disease, unspecified: Secondary | ICD-10-CM | POA: Diagnosis not present

## 2017-05-10 DIAGNOSIS — J449 Chronic obstructive pulmonary disease, unspecified: Secondary | ICD-10-CM | POA: Diagnosis not present

## 2017-05-24 ENCOUNTER — Encounter: Payer: Self-pay | Admitting: Family Medicine

## 2017-05-24 ENCOUNTER — Ambulatory Visit: Payer: Medicare Other | Admitting: Family Medicine

## 2017-05-24 VITALS — BP 120/78 | HR 74 | Ht 66.0 in | Wt 144.0 lb

## 2017-05-24 DIAGNOSIS — J209 Acute bronchitis, unspecified: Secondary | ICD-10-CM | POA: Diagnosis not present

## 2017-05-24 DIAGNOSIS — J440 Chronic obstructive pulmonary disease with acute lower respiratory infection: Secondary | ICD-10-CM | POA: Diagnosis not present

## 2017-05-24 DIAGNOSIS — F339 Major depressive disorder, recurrent, unspecified: Secondary | ICD-10-CM | POA: Diagnosis not present

## 2017-05-24 MED ORDER — IPRATROPIUM-ALBUTEROL 0.5-2.5 (3) MG/3ML IN SOLN: 3.0000 mL | Freq: Once | RESPIRATORY_TRACT | Status: DC

## 2017-05-24 MED ORDER — GUAIFENESIN-CODEINE 100-10 MG/5ML PO SYRP: 5.0000 mL | ORAL_SOLUTION | Freq: Three times a day (TID) | ORAL | 0 refills | Status: DC | PRN

## 2017-05-24 MED ORDER — LEVOFLOXACIN 500 MG PO TABS: 500.0000 mg | ORAL_TABLET | Freq: Every day | ORAL | 0 refills | Status: DC

## 2017-05-24 NOTE — Progress Notes (Signed)
Name: Marie Martin   MRN: [id number]    DOB: [birth year]   Date:05/24/2017       Progress Note  Subjective  Chief Complaint  Chief Complaint  Patient presents with  . Cough    cong, cough with no production    Cough  This is a recurrent problem. The current episode started in the past 7 days. The problem has been waxing and waning. The cough is non-productive. Associated symptoms include nasal congestion, postnasal drip, shortness of breath and wheezing. Pertinent negatives include no chest pain, chills, ear congestion, ear pain, fever, headaches, heartburn, hemoptysis, myalgias, rash, sore throat, sweats or weight loss. The symptoms are aggravated by pollens (dampness). She has tried a beta-agonist inhaler, ipratropium inhaler and steroid inhaler for the symptoms. The treatment provided mild relief. Her past medical history is significant for COPD, emphysema and environmental allergies. There is no history of asthma, bronchiectasis, bronchitis or pneumonia.    No problem-specific Assessment & Plan notes found for this encounter.   Past Medical History:  Diagnosis Date  . COPD (chronic obstructive pulmonary disease) (HCC)   . Depression     Past Surgical History:  Procedure Laterality Date  . TONSILLECTOMY      History reviewed. No pertinent family history.  Social History   Socioeconomic History  . Marital status: Widowed    Spouse name: Not on file  . Number of children: Not on file  . Years of education: Not on file  . Highest education level: Not on file  Occupational History  . Not on file  Social Needs  . Financial resource strain: Not on file  . Food insecurity:    Worry: Not on file    Inability: Not on file  . Transportation needs:    Medical: Not on file    Non-medical: Not on file  Tobacco Use  . Smoking status: Former Games developermoker  . Smokeless tobacco: Never Used  Substance and Sexual Activity  . Alcohol use: No    Alcohol/week: 0.0 oz  . Drug use: No   . Sexual activity: Not on file  Lifestyle  . Physical activity:    Days per week: Not on file    Minutes per session: Not on file  . Stress: Not on file  Relationships  . Social connections:    Talks on phone: Not on file    Gets together: Not on file    Attends religious service: Not on file    Active member of club or organization: Not on file    Attends meetings of clubs or organizations: Not on file    Relationship status: Not on file  . Intimate partner violence:    Fear of current or ex partner: Not on file    Emotionally abused: Not on file    Physically abused: Not on file    Forced sexual activity: Not on file  Other Topics Concern  . Not on file  Social History Narrative  . Not on file    Allergies  Allergen Reactions  . Amoxicillin-Pot Clavulanate Nausea Only and Other (See Comments)    Other Reaction: trouble swallowing  . Penicillins     Outpatient Medications Prior to Visit  Medication Sig Dispense Refill  . albuterol (PROVENTIL HFA;VENTOLIN HFA) 108 (90 Base) MCG/ACT inhaler Inhale 2 puffs into the lungs every 6 (six) hours as needed for wheezing or shortness of breath. 1 Inhaler 5  . albuterol (PROVENTIL) (2.5 MG/3ML) 0.083% nebulizer solution Take 3 mLs (  2.5 mg total) by nebulization every 6 (six) hours as needed for wheezing or shortness of breath. 75 mL 12  . sertraline (ZOLOFT) 100 MG tablet Take 1 tablet (100 mg total) by mouth daily. 90 tablet 2  . nystatin cream (MYCOSTATIN) Apply 1 application topically 2 (two) times daily. (Patient not taking: Reported on 11/10/2016) 30 g 0  . azithromycin (ZITHROMAX) 250 MG tablet 2 today then 1 a day for 4 days 6 tablet 0  . predniSONE (DELTASONE) 10 MG tablet Take 1 tablet (10 mg total) by mouth daily with breakfast. (Patient not taking: Reported on 04/26/2017) 30 tablet 0   No facility-administered medications prior to visit.     Review of Systems  Constitutional: Negative for chills, fever, malaise/fatigue  and weight loss.  HENT: Positive for postnasal drip. Negative for ear discharge, ear pain and sore throat.   Eyes: Negative for blurred vision.  Respiratory: Positive for cough, shortness of breath and wheezing. Negative for hemoptysis and sputum production.   Cardiovascular: Negative for chest pain, palpitations and leg swelling.  Gastrointestinal: Negative for abdominal pain, blood in stool, constipation, diarrhea, heartburn, melena and nausea.  Genitourinary: Negative for dysuria, frequency, hematuria and urgency.  Musculoskeletal: Negative for back pain, joint pain, myalgias and neck pain.  Skin: Negative for rash.  Neurological: Negative for dizziness, tingling, sensory change, focal weakness and headaches.  Endo/Heme/Allergies: Positive for environmental allergies. Negative for polydipsia. Does not bruise/bleed easily.  Psychiatric/Behavioral: Negative for depression and suicidal ideas. The patient is not nervous/anxious and does not have insomnia.      Objective  Vitals:   05/24/17 1350  BP: 120/78  Pulse: 74  SpO2: 96%  Weight: 144 lb (65.3 kg)  Height: 5\' 6"  (1.676 m)    Physical Exam  Constitutional: No distress.  HENT:  Head: Normocephalic and atraumatic.  Right Ear: External ear normal.  Left Ear: External ear normal.  Nose: Nose normal.  Mouth/Throat: Oropharynx is clear and moist.  Eyes: Pupils are equal, round, and reactive to light. Conjunctivae and EOM are normal. Right eye exhibits no discharge. Left eye exhibits no discharge.  Neck: Normal range of motion. Neck supple. No JVD present. No thyromegaly present.  Cardiovascular: Normal rate, regular rhythm, normal heart sounds and intact distal pulses. Exam reveals no gallop and no friction rub.  No murmur heard. Pulmonary/Chest: Effort normal. No stridor. No respiratory distress. She has wheezes. She has no rales.  Abdominal: Soft. Bowel sounds are normal. She exhibits no mass. There is no tenderness. There is  no guarding.  Musculoskeletal: Normal range of motion. She exhibits no edema.  Lymphadenopathy:    She has no cervical adenopathy.  Neurological: She is alert. She has normal reflexes.  Skin: Skin is warm and dry. She is not diaphoretic.  Nursing note and vitals reviewed.     Assessment & Plan  Problem List Items Addressed This Visit      Other   Recurrent major depressive episodes (HCC)    Other Visit Diagnoses    COPD (chronic obstructive pulmonary disease) with acute bronchitis (HCC)    -  Primary   Relevant Medications   ipratropium-albuterol (DUONEB) 0.5-2.5 (3) MG/3ML nebulizer solution 3 mL   guaiFENesin-codeine (ROBITUSSIN AC) 100-10 MG/5ML syrup   levofloxacin (LEVAQUIN) 500 MG tablet      Meds ordered this encounter  Medications  . ipratropium-albuterol (DUONEB) 0.5-2.5 (3) MG/3ML nebulizer solution 3 mL  . guaiFENesin-codeine (ROBITUSSIN AC) 100-10 MG/5ML syrup    Sig: Take 5  mLs by mouth 3 (three) times daily as needed for cough.    Dispense:  150 mL    Refill:  0  . levofloxacin (LEVAQUIN) 500 MG tablet    Sig: Take 1 tablet (500 mg total) by mouth daily.    Dispense:  7 tablet    Refill:  0      Dr. [facility]  05/24/17

## 2017-06-09 DIAGNOSIS — J449 Chronic obstructive pulmonary disease, unspecified: Secondary | ICD-10-CM | POA: Diagnosis not present

## 2017-07-10 DIAGNOSIS — J449 Chronic obstructive pulmonary disease, unspecified: Secondary | ICD-10-CM | POA: Diagnosis not present

## 2017-07-16 ENCOUNTER — Other Ambulatory Visit: Payer: Self-pay | Admitting: Family Medicine

## 2017-07-22 ENCOUNTER — Ambulatory Visit: Payer: Medicare Other | Admitting: Family Medicine

## 2017-07-22 ENCOUNTER — Encounter: Payer: Self-pay | Admitting: Family Medicine

## 2017-07-22 VITALS — BP 110/78 | HR 72 | Ht 66.0 in | Wt 144.0 lb

## 2017-07-22 DIAGNOSIS — J440 Chronic obstructive pulmonary disease with acute lower respiratory infection: Secondary | ICD-10-CM

## 2017-07-22 DIAGNOSIS — J432 Centrilobular emphysema: Secondary | ICD-10-CM

## 2017-07-22 DIAGNOSIS — J209 Acute bronchitis, unspecified: Secondary | ICD-10-CM | POA: Diagnosis not present

## 2017-07-22 MED ORDER — AZITHROMYCIN 250 MG PO TABS: ORAL_TABLET | ORAL | 0 refills | Status: DC

## 2017-07-22 MED ORDER — IPRATROPIUM-ALBUTEROL 20-100 MCG/ACT IN AERS: 1.0000 | INHALATION_SPRAY | Freq: Four times a day (QID) | RESPIRATORY_TRACT | 11 refills | Status: DC

## 2017-07-22 MED ORDER — FLUTICASONE PROPIONATE HFA 110 MCG/ACT IN AERO: 1.0000 | INHALATION_SPRAY | Freq: Two times a day (BID) | RESPIRATORY_TRACT | 12 refills | Status: DC

## 2017-07-22 NOTE — Progress Notes (Signed)
Name: Marie Martin   MRN: [id number]    DOB: [birth year]   Date:07/22/2017       Progress Note  Subjective  Chief Complaint  Chief Complaint  Patient presents with  . COPD    pt is in the donut hole for the rest of the year. I tried to do a PA and haven't heard anything. What can she do to get through    Cough  This is a new problem. The current episode started yesterday. The problem has been gradually worsening. The cough is productive of purulent sputum (yellow). Pertinent negatives include no chest pain, chills, ear congestion, ear pain, fever, headaches, heartburn, hemoptysis, myalgias, nasal congestion, postnasal drip, rash, rhinorrhea, sore throat, shortness of breath, sweats, weight loss or wheezing. The symptoms are aggravated by pollens. She has tried a beta-agonist inhaler, ipratropium inhaler and steroid inhaler for the symptoms. The treatment provided moderate relief. Her past medical history is significant for bronchitis and COPD. There is no history of environmental allergies.    No problem-specific Assessment & Plan notes found for this encounter.   Past Medical History:  Diagnosis Date  . COPD (chronic obstructive pulmonary disease) (HCC)   . Depression     Past Surgical History:  Procedure Laterality Date  . TONSILLECTOMY      History reviewed. No pertinent family history.  Social History   Socioeconomic History  . Marital status: Widowed    Spouse name: Not on file  . Number of children: Not on file  . Years of education: Not on file  . Highest education level: Not on file  Occupational History  . Not on file  Social Needs  . Financial resource strain: Not on file  . Food insecurity:    Worry: Not on file    Inability: Not on file  . Transportation needs:    Medical: Not on file    Non-medical: Not on file  Tobacco Use  . Smoking status: Former Games developer  . Smokeless tobacco: Never Used  Substance and Sexual Activity  . Alcohol use: No   Alcohol/week: 0.0 oz  . Drug use: No  . Sexual activity: Not on file  Lifestyle  . Physical activity:    Days per week: Not on file    Minutes per session: Not on file  . Stress: Not on file  Relationships  . Social connections:    Talks on phone: Not on file    Gets together: Not on file    Attends religious service: Not on file    Active member of club or organization: Not on file    Attends meetings of clubs or organizations: Not on file    Relationship status: Not on file  . Intimate partner violence:    Fear of current or ex partner: Not on file    Emotionally abused: Not on file    Physically abused: Not on file    Forced sexual activity: Not on file  Other Topics Concern  . Not on file  Social History Narrative  . Not on file    Allergies  Allergen Reactions  . Amoxicillin-Pot Clavulanate Nausea Only and Other (See Comments)    Other Reaction: trouble swallowing  . Penicillins     Outpatient Medications Prior to Visit  Medication Sig Dispense Refill  . albuterol (PROVENTIL HFA;VENTOLIN HFA) 108 (90 Base) MCG/ACT inhaler Inhale 2 puffs into the lungs every 6 (six) hours as needed for wheezing or shortness of breath. 1 Inhaler 5  .  albuterol (PROVENTIL) (2.5 MG/3ML) 0.083% nebulizer solution Take 3 mLs (2.5 mg total) by nebulization every 6 (six) hours as needed for wheezing or shortness of breath. 75 mL 12  . Fluticasone-Salmeterol (ADVAIR DISKUS) 250-50 MCG/DOSE AEPB Inhale 1 puff into the lungs 2 (two) times daily.    . sertraline (ZOLOFT) 100 MG tablet Take 1 tablet (100 mg total) by mouth daily. 90 tablet 2  . tiotropium (SPIRIVA HANDIHALER) 18 MCG inhalation capsule Place 1 capsule into inhaler and inhale daily.    Marland Kitchen. nystatin cream (MYCOSTATIN) Apply 1 application topically 2 (two) times daily. (Patient not taking: Reported on 11/10/2016) 30 g 0  . guaiFENesin-codeine (ROBITUSSIN AC) 100-10 MG/5ML syrup Take 5 mLs by mouth 3 (three) times daily as needed for cough.  150 mL 0  . levofloxacin (LEVAQUIN) 500 MG tablet Take 1 tablet (500 mg total) by mouth daily. 7 tablet 0   Facility-Administered Medications Prior to Visit  Medication Dose Route Frequency Provider Last Rate Last Dose  . ipratropium-albuterol (DUONEB) 0.5-2.5 (3) MG/3ML nebulizer solution 3 mL  3 mL Nebulization Once Duanne LimerickJones, Alphons Burgert C, MD        Review of Systems  Constitutional: Negative for chills, fever, malaise/fatigue and weight loss.  HENT: Negative for ear discharge, ear pain, postnasal drip, rhinorrhea and sore throat.   Eyes: Negative for blurred vision.  Respiratory: Positive for cough. Negative for hemoptysis, sputum production, shortness of breath and wheezing.   Cardiovascular: Negative for chest pain, palpitations and leg swelling.  Gastrointestinal: Negative for abdominal pain, blood in stool, constipation, diarrhea, heartburn, melena and nausea.  Genitourinary: Negative for dysuria, frequency, hematuria and urgency.  Musculoskeletal: Negative for back pain, joint pain, myalgias and neck pain.  Skin: Negative for rash.  Neurological: Negative for dizziness, tingling, sensory change, focal weakness and headaches.  Endo/Heme/Allergies: Negative for environmental allergies and polydipsia. Does not bruise/bleed easily.  Psychiatric/Behavioral: Negative for depression and suicidal ideas. The patient is not nervous/anxious and does not have insomnia.      Objective  Vitals:   07/22/17 1333  BP: 110/78  Pulse: 72  Weight: 144 lb (65.3 kg)  Height: 5\' 6"  (1.676 m)    Physical Exam  Constitutional: No distress.  HENT:  Head: Normocephalic and atraumatic.  Right Ear: External ear normal.  Left Ear: External ear normal.  Nose: Nose normal.  Mouth/Throat: Oropharynx is clear and moist.  Eyes: Pupils are equal, round, and reactive to light. Conjunctivae and EOM are normal. Right eye exhibits no discharge. Left eye exhibits no discharge.  Neck: Normal range of motion. Neck  supple. No JVD present. No thyromegaly present.  Cardiovascular: Normal rate, regular rhythm, normal heart sounds and intact distal pulses. Exam reveals no gallop and no friction rub.  No murmur heard. Pulmonary/Chest: Effort normal. She has wheezes. She has no rales.  Abdominal: Soft. Bowel sounds are normal. She exhibits no mass. There is no tenderness. There is no guarding.  Musculoskeletal: Normal range of motion. She exhibits no edema.  Lymphadenopathy:    She has no cervical adenopathy.  Neurological: She is alert. She has normal reflexes.  Skin: Skin is warm and dry. She is not diaphoretic.      Assessment & Plan  Problem List Items Addressed This Visit      Respiratory   Centriacinar emphysema (HCC)   Relevant Medications   Fluticasone-Salmeterol (ADVAIR DISKUS) 250-50 MCG/DOSE AEPB   tiotropium (SPIRIVA HANDIHALER) 18 MCG inhalation capsule   Ipratropium-Albuterol (COMBIVENT) 20-100 MCG/ACT AERS respimat  fluticasone (FLOVENT HFA) 110 MCG/ACT inhaler   azithromycin (ZITHROMAX) 250 MG tablet    Other Visit Diagnoses    COPD with acute bronchitis (HCC)    -  Primary   change made to medicine due to insurance   Relevant Medications   Fluticasone-Salmeterol (ADVAIR DISKUS) 250-50 MCG/DOSE AEPB   tiotropium (SPIRIVA HANDIHALER) 18 MCG inhalation capsule   Ipratropium-Albuterol (COMBIVENT) 20-100 MCG/ACT AERS respimat   fluticasone (FLOVENT HFA) 110 MCG/ACT inhaler   azithromycin (ZITHROMAX) 250 MG tablet      Meds ordered this encounter  Medications  . Ipratropium-Albuterol (COMBIVENT) 20-100 MCG/ACT AERS respimat    Sig: Inhale 1 puff into the lungs every 6 (six) hours.    Dispense:  1 Inhaler    Refill:  11  . fluticasone (FLOVENT HFA) 110 MCG/ACT inhaler    Sig: Inhale 1 puff into the lungs 2 (two) times daily.    Dispense:  1 Inhaler    Refill:  12  . azithromycin (ZITHROMAX) 250 MG tablet    Sig: 2 today then 1 a day for 4 days    Dispense:  6 tablet     Refill:  0      Dr. [facility]  07/22/17

## 2017-08-09 DIAGNOSIS — J449 Chronic obstructive pulmonary disease, unspecified: Secondary | ICD-10-CM | POA: Diagnosis not present

## 2017-08-13 ENCOUNTER — Other Ambulatory Visit: Payer: Self-pay | Admitting: Family Medicine

## 2017-08-13 DIAGNOSIS — F339 Major depressive disorder, recurrent, unspecified: Secondary | ICD-10-CM

## 2017-08-26 ENCOUNTER — Encounter: Payer: Self-pay | Admitting: Family Medicine

## 2017-08-26 ENCOUNTER — Ambulatory Visit: Payer: Medicare Other | Admitting: Family Medicine

## 2017-08-26 VITALS — BP 130/70 | HR 68 | Ht 66.0 in | Wt 145.0 lb

## 2017-08-26 DIAGNOSIS — J432 Centrilobular emphysema: Secondary | ICD-10-CM | POA: Diagnosis not present

## 2017-08-26 MED ORDER — TIOTROPIUM BROMIDE MONOHYDRATE 18 MCG IN CAPS
1.0000 | ORAL_CAPSULE | Freq: Every day | RESPIRATORY_TRACT | 1 refills | Status: DC
Start: 2017-08-26 — End: 2019-02-14

## 2017-08-26 MED ORDER — FLUTICASONE-SALMETEROL 250-50 MCG/DOSE IN AEPB: 1.0000 | INHALATION_SPRAY | Freq: Two times a day (BID) | RESPIRATORY_TRACT | 1 refills | Status: DC

## 2017-08-26 NOTE — Progress Notes (Signed)
Name: Marie Martin   MRN: [id number]    DOB: 07-14-1931   Date:08/26/2017       Progress Note  Subjective  Chief Complaint  Chief Complaint  Patient presents with  . COPD    back on Advair and Spiriva- insurance started paying again    COPD  There is no chest tightness, cough, difficulty breathing, frequent throat clearing, hemoptysis, hoarse voice, shortness of breath, sputum production or wheezing. This is a chronic problem. The current episode started more than 1 year ago. The problem occurs intermittently. The problem has been gradually improving. Pertinent negatives include no appetite change, chest pain, dyspnea on exertion, ear congestion, ear pain, fever, headaches, heartburn, malaise/fatigue, myalgias, nasal congestion, orthopnea, PND, postnasal drip, rhinorrhea, sneezing, sore throat, sweats, trouble swallowing or weight loss. Her symptoms are aggravated by nothing. Her symptoms are alleviated by beta-agonist, ipratropium and steroid inhaler. She reports moderate improvement on treatment. Her past medical history is significant for COPD. There is no history of asthma, bronchiectasis, bronchitis, emphysema or pneumonia.    Centriacinar emphysema Switch back to Advair and Spiriva- insurance is paying for med again- stable on this regimen   Past Medical History:  Diagnosis Date  . COPD (chronic obstructive pulmonary disease) (HCC)   . Depression     Past Surgical History:  Procedure Laterality Date  . TONSILLECTOMY      History reviewed. No pertinent family history.  Social History   Socioeconomic History  . Marital status: Widowed    Spouse name: Not on file  . Number of children: Not on file  . Years of education: Not on file  . Highest education level: Not on file  Occupational History  . Not on file  Social Needs  . Financial resource strain: Not on file  . Food insecurity:    Worry: Not on file    Inability: Not on file  . Transportation needs:   Medical: Not on file    Non-medical: Not on file  Tobacco Use  . Smoking status: Former Games developermoker  . Smokeless tobacco: Never Used  Substance and Sexual Activity  . Alcohol use: No    Alcohol/week: 0.0 oz  . Drug use: No  . Sexual activity: Not on file  Lifestyle  . Physical activity:    Days per week: Not on file    Minutes per session: Not on file  . Stress: Not on file  Relationships  . Social connections:    Talks on phone: Not on file    Gets together: Not on file    Attends religious service: Not on file    Active member of club or organization: Not on file    Attends meetings of clubs or organizations: Not on file    Relationship status: Not on file  . Intimate partner violence:    Fear of current or ex partner: Not on file    Emotionally abused: Not on file    Physically abused: Not on file    Forced sexual activity: Not on file  Other Topics Concern  . Not on file  Social History Narrative  . Not on file    Allergies  Allergen Reactions  . Amoxicillin-Pot Clavulanate Nausea Only and Other (See Comments)    Other Reaction: trouble swallowing  . Penicillins     Outpatient Medications Prior to Visit  Medication Sig Dispense Refill  . albuterol (PROVENTIL HFA;VENTOLIN HFA) 108 (90 Base) MCG/ACT inhaler Inhale 2 puffs into the lungs every 6 (six)  hours as needed for wheezing or shortness of breath. 1 Inhaler 5  . albuterol (PROVENTIL) (2.5 MG/3ML) 0.083% nebulizer solution Take 3 mLs (2.5 mg total) by nebulization every 6 (six) hours as needed for wheezing or shortness of breath. 75 mL 12  . sertraline (ZOLOFT) 100 MG tablet TAKE 1 TABLET BY MOUTH DAILY 90 tablet 1  . Fluticasone-Salmeterol (ADVAIR DISKUS) 250-50 MCG/DOSE AEPB Inhale 1 puff into the lungs 2 (two) times daily.    Marland Kitchen tiotropium (SPIRIVA HANDIHALER) 18 MCG inhalation capsule Place 1 capsule into inhaler and inhale daily.    Marland Kitchen nystatin cream (MYCOSTATIN) Apply 1 application topically 2 (two) times daily.  (Patient not taking: Reported on 11/10/2016) 30 g 0  . azithromycin (ZITHROMAX) 250 MG tablet 2 today then 1 a day for 4 days 6 tablet 0  . fluticasone (FLOVENT HFA) 110 MCG/ACT inhaler Inhale 1 puff into the lungs 2 (two) times daily. 1 Inhaler 12  . Ipratropium-Albuterol (COMBIVENT) 20-100 MCG/ACT AERS respimat Inhale 1 puff into the lungs every 6 (six) hours. 1 Inhaler 11   Facility-Administered Medications Prior to Visit  Medication Dose Route Frequency Provider Last Rate Last Dose  . ipratropium-albuterol (DUONEB) 0.5-2.5 (3) MG/3ML nebulizer solution 3 mL  3 mL Nebulization Once Duanne Limerick, MD        Review of Systems  Constitutional: Negative for appetite change, chills, fever, malaise/fatigue and weight loss.  HENT: Negative for ear discharge, ear pain, hoarse voice, postnasal drip, rhinorrhea, sneezing, sore throat and trouble swallowing.   Eyes: Negative for blurred vision.  Respiratory: Negative for cough, hemoptysis, sputum production, shortness of breath and wheezing.   Cardiovascular: Negative for chest pain, dyspnea on exertion, palpitations, leg swelling and PND.  Gastrointestinal: Negative for abdominal pain, blood in stool, constipation, diarrhea, heartburn, melena and nausea.  Genitourinary: Negative for dysuria, frequency, hematuria and urgency.  Musculoskeletal: Negative for back pain, joint pain, myalgias and neck pain.  Skin: Negative for rash.  Neurological: Negative for dizziness, tingling, sensory change, focal weakness and headaches.  Endo/Heme/Allergies: Negative for environmental allergies and polydipsia. Does not bruise/bleed easily.  Psychiatric/Behavioral: Negative for depression and suicidal ideas. The patient is not nervous/anxious and does not have insomnia.      Objective  Vitals:   08/26/17 1336  BP: 130/70  Pulse: 68  SpO2: 96%  Weight: 145 lb (65.8 kg)  Height: 5\' 6"  (1.676 m)    Physical Exam  Constitutional: She is oriented to person,  place, and time. She appears well-developed and well-nourished.  HENT:  Head: Normocephalic.  Right Ear: External ear normal.  Left Ear: External ear normal.  Mouth/Throat: Oropharynx is clear and moist.  Eyes: Pupils are equal, round, and reactive to light. Conjunctivae and EOM are normal. Lids are everted and swept, no foreign bodies found. Left eye exhibits no hordeolum. No foreign body present in the left eye. Right conjunctiva is not injected. Left conjunctiva is not injected. No scleral icterus.  Neck: Normal range of motion. Neck supple. No JVD present. No tracheal deviation present. No thyromegaly present.  Cardiovascular: Normal rate, regular rhythm, normal heart sounds and intact distal pulses. Exam reveals no gallop and no friction rub.  No murmur heard. Pulmonary/Chest: Effort normal and breath sounds normal. No respiratory distress. She has no wheezes. She has no rales.  Abdominal: Soft. Bowel sounds are normal. She exhibits no mass. There is no hepatosplenomegaly. There is no tenderness. There is no rebound and no guarding.  Musculoskeletal: Normal range of motion.  She exhibits no edema or tenderness.  Lymphadenopathy:    She has no cervical adenopathy.  Neurological: She is alert and oriented to person, place, and time. She has normal strength. She displays normal reflexes. No cranial nerve deficit.  Skin: Skin is warm. No rash noted.  Psychiatric: She has a normal mood and affect. Her mood appears not anxious. She does not exhibit a depressed mood.  Nursing note and vitals reviewed.     Assessment & Plan  Problem List Items Addressed This Visit      Respiratory   Centriacinar emphysema (HCC) - Primary    Switch back to Advair and Spiriva- insurance is paying for med again- stable on this regimen      Relevant Medications   tiotropium (SPIRIVA HANDIHALER) 18 MCG inhalation capsule   Fluticasone-Salmeterol (ADVAIR DISKUS) 250-50 MCG/DOSE AEPB      Meds ordered this  encounter  Medications  . tiotropium (SPIRIVA HANDIHALER) 18 MCG inhalation capsule    Sig: Place 1 capsule (18 mcg total) into inhaler and inhale daily.    Dispense:  90 capsule    Refill:  1  . Fluticasone-Salmeterol (ADVAIR DISKUS) 250-50 MCG/DOSE AEPB    Sig: Inhale 1 puff into the lungs 2 (two) times daily.    Dispense:  3 each    Refill:  1      Dr. [facility]  08/26/17

## 2017-08-26 NOTE — Assessment & Plan Note (Signed)
Switch back to Advair and Spiriva- insurance is paying for med again- stable on this regimen

## 2017-09-01 DIAGNOSIS — Z9981 Dependence on supplemental oxygen: Secondary | ICD-10-CM | POA: Diagnosis not present

## 2017-09-01 DIAGNOSIS — J449 Chronic obstructive pulmonary disease, unspecified: Secondary | ICD-10-CM | POA: Diagnosis not present

## 2017-09-01 DIAGNOSIS — R0609 Other forms of dyspnea: Secondary | ICD-10-CM | POA: Diagnosis not present

## 2017-09-09 DIAGNOSIS — J449 Chronic obstructive pulmonary disease, unspecified: Secondary | ICD-10-CM | POA: Diagnosis not present

## 2017-10-10 DIAGNOSIS — J449 Chronic obstructive pulmonary disease, unspecified: Secondary | ICD-10-CM | POA: Diagnosis not present

## 2017-10-21 ENCOUNTER — Ambulatory Visit (INDEPENDENT_AMBULATORY_CARE_PROVIDER_SITE_OTHER): Payer: Medicare Other

## 2017-10-21 DIAGNOSIS — Z23 Encounter for immunization: Secondary | ICD-10-CM

## 2017-11-09 DIAGNOSIS — J449 Chronic obstructive pulmonary disease, unspecified: Secondary | ICD-10-CM | POA: Diagnosis not present

## 2017-12-10 DIAGNOSIS — J449 Chronic obstructive pulmonary disease, unspecified: Secondary | ICD-10-CM | POA: Diagnosis not present

## 2017-12-29 DIAGNOSIS — J449 Chronic obstructive pulmonary disease, unspecified: Secondary | ICD-10-CM | POA: Diagnosis not present

## 2017-12-29 DIAGNOSIS — R0609 Other forms of dyspnea: Secondary | ICD-10-CM | POA: Diagnosis not present

## 2018-01-04 ENCOUNTER — Other Ambulatory Visit: Payer: Self-pay | Admitting: Family Medicine

## 2018-01-04 DIAGNOSIS — J432 Centrilobular emphysema: Secondary | ICD-10-CM

## 2018-01-09 DIAGNOSIS — J449 Chronic obstructive pulmonary disease, unspecified: Secondary | ICD-10-CM | POA: Diagnosis not present

## 2018-01-17 ENCOUNTER — Ambulatory Visit (INDEPENDENT_AMBULATORY_CARE_PROVIDER_SITE_OTHER): Payer: Medicare Other | Admitting: Family Medicine

## 2018-01-17 ENCOUNTER — Encounter: Payer: Self-pay | Admitting: Family Medicine

## 2018-01-17 VITALS — BP 120/70 | HR 72 | Ht 66.0 in | Wt 140.0 lb

## 2018-01-17 DIAGNOSIS — J0100 Acute maxillary sinusitis, unspecified: Secondary | ICD-10-CM

## 2018-01-17 DIAGNOSIS — J441 Chronic obstructive pulmonary disease with (acute) exacerbation: Secondary | ICD-10-CM | POA: Diagnosis not present

## 2018-01-17 DIAGNOSIS — J449 Chronic obstructive pulmonary disease, unspecified: Secondary | ICD-10-CM

## 2018-01-17 MED ORDER — GUAIFENESIN-CODEINE 100-10 MG/5ML PO SYRP: 5.0000 mL | ORAL_SOLUTION | Freq: Three times a day (TID) | ORAL | 0 refills | Status: DC | PRN

## 2018-01-17 MED ORDER — AZITHROMYCIN 250 MG PO TABS: ORAL_TABLET | ORAL | 0 refills | Status: DC

## 2018-01-17 MED ORDER — PREDNISONE 10 MG PO TABS: 10.0000 mg | ORAL_TABLET | Freq: Every day | ORAL | 0 refills | Status: DC

## 2018-01-17 NOTE — Progress Notes (Signed)
Date:  01/17/2018   Name:  Marie BearsBeverly N Hardeman   DOB:  [birth year]   MRN:  [id number]   Chief Complaint: Sinusitis (sore throat, green production) Sinusitis  This is a new problem. The current episode started in the past 7 days. The problem has been gradually worsening since onset. There has been no fever. She is experiencing no pain. Associated symptoms include congestion and coughing. Pertinent negatives include no chills, diaphoresis, ear pain, headaches, hoarse voice, neck pain, shortness of breath, sinus pressure, sneezing, sore throat or swollen glands. Past treatments include nothing.  Cough  This is a new problem. The current episode started in the past 7 days. The problem has been unchanged. Cough characteristics: prod yellow to green. Pertinent negatives include no chest pain, chills, ear pain, eye redness, fever, headaches, hemoptysis, myalgias, nasal congestion, rash, rhinorrhea, sore throat, shortness of breath, sweats or wheezing. Her past medical history is significant for COPD. There is no history of environmental allergies.  Shortness of Breath  This is a chronic problem. The current episode started 1 to 4 weeks ago. The problem occurs daily. The problem has been unchanged. Pertinent negatives include no abdominal pain, chest pain, claudication, coryza, ear pain, fever, headaches, hemoptysis, neck pain, rash, rhinorrhea, sore throat, swollen glands, vomiting or wheezing. Her past medical history is significant for COPD.     Review of Systems  Constitutional: Negative.  Negative for chills, diaphoresis, fatigue, fever and unexpected weight change.  HENT: Positive for congestion. Negative for ear discharge, ear pain, hoarse voice, rhinorrhea, sinus pressure, sneezing and sore throat.   Eyes: Negative for photophobia, pain, discharge, redness and itching.  Respiratory: Positive for cough. Negative for hemoptysis, shortness of breath, wheezing and stridor.   Cardiovascular: Negative  for chest pain and claudication.  Gastrointestinal: Negative for abdominal pain, blood in stool, constipation, diarrhea, nausea and vomiting.  Endocrine: Negative for cold intolerance, heat intolerance, polydipsia, polyphagia and polyuria.  Genitourinary: Negative for dysuria, flank pain, frequency, hematuria, menstrual problem, pelvic pain, urgency, vaginal bleeding and vaginal discharge.  Musculoskeletal: Negative for arthralgias, back pain, myalgias and neck pain.  Skin: Negative for rash.  Allergic/Immunologic: Negative for environmental allergies and food allergies.  Neurological: Negative for dizziness, weakness, light-headedness, numbness and headaches.  Hematological: Negative for adenopathy. Does not bruise/bleed easily.  Psychiatric/Behavioral: Negative for dysphoric mood. The patient is not nervous/anxious.     Patient Active Problem List   Diagnosis Date Noted  . 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 Recurrent major depressive episodes (HCC) 05/31/2014    Allergies  Allergen Reactions  . Amoxicillin-Pot Clavulanate Nausea Only and Other (See Comments)    Other Reaction: trouble swallowing  . Penicillins     Past Surgical History:  Procedure Laterality Date  . TONSILLECTOMY      Social History   Tobacco Use  . Smoking status: Former Games developermoker  . Smokeless tobacco: Never Used  Substance Use Topics  . Alcohol use: No    Alcohol/week: 0.0 standard drinks  . Drug use: No     Medication list has been reviewed and updated.  Current Meds  Medication Sig  . ADVAIR DISKUS 250-50 MCG/DOSE AEPB INHALE 1 PUFF BY MOUTH TWICE DAILY  . albuterol (PROVENTIL HFA;VENTOLIN HFA) 108 (90 Base) MCG/ACT inhaler Inhale 2 puffs into the lungs every 6 (six) hours as needed for wheezing or shortness of breath.  Marland Kitchen. albuterol (PROVENTIL) (2.5 MG/3ML) 0.083% nebulizer solution Take 3 mLs (2.5 mg total) by nebulization every 6 (six) hours  as needed for wheezing or shortness of breath.    . sertraline (ZOLOFT) 100 MG tablet TAKE 1 TABLET BY MOUTH DAILY  . tiotropium (SPIRIVA HANDIHALER) 18 MCG inhalation capsule Place 1 capsule (18 mcg total) into inhaler and inhale daily.   Current Facility-Administered Medications for the 01/17/18 encounter (Office Visit) with Duanne Limerick, MD  Medication  . ipratropium-albuterol (DUONEB) 0.5-2.5 (3) MG/3ML nebulizer solution 3 mL    PHQ 2/9 Scores 01/17/2018 08/26/2017 10/01/2016 06/14/2015  PHQ - 2 Score 0 0 0 0  PHQ- 9 Score 0 0 - -    Physical Exam  Constitutional: No distress.  HENT:  Head: Normocephalic and atraumatic.  Right Ear: External ear normal.  Left Ear: External ear normal.  Nose: Nose normal.  Mouth/Throat: Oropharynx is clear and moist.  Eyes: Pupils are equal, round, and reactive to light. Conjunctivae and EOM are normal. Right eye exhibits no discharge. Left eye exhibits no discharge.  Neck: Normal range of motion. Neck supple. No JVD present. No thyromegaly present.  Cardiovascular: Normal rate, regular rhythm, normal heart sounds and intact distal pulses. Exam reveals no gallop and no friction rub.  No murmur heard. Pulmonary/Chest: Effort normal. No accessory muscle usage or stridor. No tachypnea. No respiratory distress. She has no decreased breath sounds. She has wheezes. She has no rhonchi. She has no rales. She exhibits no tenderness.  Abdominal: Soft. Bowel sounds are normal. She exhibits no mass. There is no tenderness. There is no guarding.  Musculoskeletal: Normal range of motion. She exhibits no edema.  Lymphadenopathy:    She has no cervical adenopathy.  Neurological: She is alert. She has normal reflexes.  Skin: Skin is warm and dry. She is not diaphoretic.  Nursing note and vitals reviewed.   BP 120/70   Pulse 72   Ht 5\' 6"  (1.676 m)   Wt 140 lb (63.5 kg)   SpO2 97%   BMI 22.60 kg/m   Assessment and Plan:  1. Acute exacerbation of chronic obstructive pulmonary disease (COPD)  (HCC) Patient with history of COPD and acute exacerbation requiring prednisone 10 mg once a day control of cough with Robitussin-AC. - predniSONE (DELTASONE) 10 MG tablet; Take 1 tablet (10 mg total) by mouth daily with breakfast.  Dispense: 30 tablet; Refill: 0 - guaiFENesin-codeine (ROBITUSSIN AC) 100-10 MG/5ML syrup; Take 5 mLs by mouth 3 (three) times daily as needed for cough.  Dispense: 150 mL; Refill: 0  2. Moderate COPD (chronic obstructive pulmonary disease) (HCC) Her baseline is a moderate COPD followed with Dr. Meredeth Ide.  Patient is to continue her Advair 2 50-50, cure-all hand-held inhaler and/or Proventil nebulizer solution.  3. Acute non-recurrent maxillary sinusitis Acute.  Persistent.  Will initiate azithromycin 250 mg 2 today then 1 a day for 4 days. - azithromycin (ZITHROMAX) 250 MG tablet; 2 today then 1 a day for 4 days  Dispense: 6 tablet; Refill: 0   Dr. [facility]  01/17/2018

## 2018-01-31 ENCOUNTER — Other Ambulatory Visit: Payer: Self-pay | Admitting: Family Medicine

## 2018-01-31 DIAGNOSIS — F339 Major depressive disorder, recurrent, unspecified: Secondary | ICD-10-CM

## 2018-02-09 DIAGNOSIS — J449 Chronic obstructive pulmonary disease, unspecified: Secondary | ICD-10-CM | POA: Diagnosis not present

## 2018-03-02 DIAGNOSIS — J441 Chronic obstructive pulmonary disease with (acute) exacerbation: Secondary | ICD-10-CM | POA: Diagnosis not present

## 2018-03-02 DIAGNOSIS — J069 Acute upper respiratory infection, unspecified: Secondary | ICD-10-CM | POA: Diagnosis not present

## 2018-03-12 DIAGNOSIS — J449 Chronic obstructive pulmonary disease, unspecified: Secondary | ICD-10-CM | POA: Diagnosis not present

## 2018-04-01 DIAGNOSIS — J8489 Other specified interstitial pulmonary diseases: Secondary | ICD-10-CM | POA: Diagnosis not present

## 2018-04-01 DIAGNOSIS — R05 Cough: Secondary | ICD-10-CM | POA: Diagnosis not present

## 2018-04-01 DIAGNOSIS — J441 Chronic obstructive pulmonary disease with (acute) exacerbation: Secondary | ICD-10-CM | POA: Diagnosis not present

## 2018-04-10 DIAGNOSIS — J449 Chronic obstructive pulmonary disease, unspecified: Secondary | ICD-10-CM | POA: Diagnosis not present

## 2018-04-25 ENCOUNTER — Other Ambulatory Visit: Payer: Self-pay | Admitting: Family Medicine

## 2018-04-25 DIAGNOSIS — J432 Centrilobular emphysema: Secondary | ICD-10-CM

## 2018-04-25 DIAGNOSIS — F339 Major depressive disorder, recurrent, unspecified: Secondary | ICD-10-CM

## 2018-04-28 ENCOUNTER — Encounter: Payer: Self-pay | Admitting: Family Medicine

## 2018-04-28 ENCOUNTER — Ambulatory Visit (INDEPENDENT_AMBULATORY_CARE_PROVIDER_SITE_OTHER): Payer: Medicare Other | Admitting: Family Medicine

## 2018-04-28 ENCOUNTER — Other Ambulatory Visit: Payer: Self-pay

## 2018-04-28 VITALS — BP 124/78 | HR 74 | Ht 66.0 in | Wt 141.0 lb

## 2018-04-28 DIAGNOSIS — B379 Candidiasis, unspecified: Secondary | ICD-10-CM

## 2018-04-28 DIAGNOSIS — J441 Chronic obstructive pulmonary disease with (acute) exacerbation: Secondary | ICD-10-CM | POA: Diagnosis not present

## 2018-04-28 DIAGNOSIS — J440 Chronic obstructive pulmonary disease with acute lower respiratory infection: Secondary | ICD-10-CM | POA: Diagnosis not present

## 2018-04-28 DIAGNOSIS — F339 Major depressive disorder, recurrent, unspecified: Secondary | ICD-10-CM

## 2018-04-28 DIAGNOSIS — J209 Acute bronchitis, unspecified: Secondary | ICD-10-CM

## 2018-04-28 MED ORDER — GUAIFENESIN-CODEINE 100-10 MG/5ML PO SYRP: 5.0000 mL | ORAL_SOLUTION | Freq: Three times a day (TID) | ORAL | 0 refills | Status: DC | PRN

## 2018-04-28 MED ORDER — NYSTATIN 100000 UNIT/GM EX CREA: 1.0000 "application " | TOPICAL_CREAM | Freq: Two times a day (BID) | CUTANEOUS | 0 refills | Status: DC

## 2018-04-28 MED ORDER — PREDNISONE 10 MG PO TABS: 10.0000 mg | ORAL_TABLET | Freq: Every day | ORAL | 0 refills | Status: DC

## 2018-04-28 MED ORDER — AZITHROMYCIN 250 MG PO TABS: ORAL_TABLET | ORAL | 0 refills | Status: DC

## 2018-04-28 NOTE — Progress Notes (Signed)
Date:  04/28/2018   Name:  Marie Martin   DOB:  [birth year]   MRN:  [id number]   Chief Complaint: COPD (Started 3 days ago. Coughing green/yellow mucous. Wants prednisone and abx. )  COPD  She complains of cough, difficulty breathing, shortness of breath, sputum production and wheezing. There is no chest tightness, frequent throat clearing, hemoptysis or hoarse voice. This is a chronic problem. Episode onset: worse over the last week. The problem occurs intermittently. The problem has been gradually worsening. The cough is productive of purulent sputum and productive. Associated symptoms include dyspnea on exertion and orthopnea. Pertinent negatives include no appetite change, chest pain, ear congestion, ear pain, fever, headaches, heartburn, malaise/fatigue, myalgias, nasal congestion, PND, postnasal drip, rhinorrhea, sneezing, sore throat, sweats, trouble swallowing or weight loss. Her symptoms are aggravated by change in weather. Her symptoms are alleviated by beta-agonist, ipratropium and oral steroids. Her past medical history is significant for bronchitis, COPD and emphysema. There is no history of asthma, bronchiectasis or pneumonia.  Cough  This is a chronic problem. The current episode started more than 1 year ago. The problem has been waxing and waning. The cough is productive of purulent sputum. Associated symptoms include shortness of breath and wheezing. Pertinent negatives include no chest pain, chills, ear congestion, ear pain, eye redness, fever, headaches, heartburn, hemoptysis, myalgias, nasal congestion, postnasal drip, rash, rhinorrhea, sore throat, sweats or weight loss. Risk factors for lung disease include occupational exposure. She has tried a beta-agonist inhaler, ipratropium inhaler and steroid inhaler for the symptoms. Her past medical history is significant for bronchitis, COPD and emphysema. There is no history of asthma, bronchiectasis, environmental allergies or  pneumonia.    Review of Systems  Constitutional: Negative.  Negative for appetite change, chills, fatigue, fever, malaise/fatigue, unexpected weight change and weight loss.  HENT: Negative for congestion, ear discharge, ear pain, hoarse voice, postnasal drip, rhinorrhea, sinus pressure, sneezing, sore throat and trouble swallowing.   Eyes: Negative for photophobia, pain, discharge, redness and itching.  Respiratory: Positive for cough, sputum production, shortness of breath and wheezing. Negative for hemoptysis and stridor.   Cardiovascular: Positive for dyspnea on exertion. Negative for chest pain and PND.  Gastrointestinal: Negative for abdominal pain, blood in stool, constipation, diarrhea, heartburn, nausea and vomiting.  Endocrine: Negative for cold intolerance, heat intolerance, polydipsia, polyphagia and polyuria.  Genitourinary: Negative for dysuria, flank pain, frequency, hematuria, menstrual problem, pelvic pain, urgency, vaginal bleeding and vaginal discharge.  Musculoskeletal: Negative for arthralgias, back pain and myalgias.  Skin: Negative for rash.  Allergic/Immunologic: Negative for environmental allergies and food allergies.  Neurological: Negative for dizziness, weakness, light-headedness, numbness and headaches.  Hematological: Negative for adenopathy. Does not bruise/bleed easily.  Psychiatric/Behavioral: Negative for dysphoric mood. The patient is not nervous/anxious.     Patient Active Problem List   Diagnosis Date Noted  . 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 Recurrent major depressive episodes (HCC) 05/31/2014    Allergies  Allergen Reactions  . Amoxicillin-Pot Clavulanate Nausea Only and Other (See Comments)    Other Reaction: trouble swallowing  . Penicillins     Past Surgical History:  Procedure Laterality Date  . TONSILLECTOMY      Social History   Tobacco Use  . Smoking status: Former Games developer  . Smokeless tobacco: Never Used  Substance Use  Topics  . Alcohol use: No    Alcohol/week: 0.0 standard drinks  . Drug use: No     Medication list has been reviewed and  updated.  Current Meds  Medication Sig  . ADVAIR DISKUS 250-50 MCG/DOSE AEPB USE ONE INHALATION BY MOUTH TWICE DAILY  . albuterol (PROVENTIL HFA;VENTOLIN HFA) 108 (90 Base) MCG/ACT inhaler Inhale 2 puffs into the lungs every 6 (six) hours as needed for wheezing or shortness of breath.  Marland Kitchen albuterol (PROVENTIL) (2.5 MG/3ML) 0.083% nebulizer solution Take 3 mLs (2.5 mg total) by nebulization every 6 (six) hours as needed for wheezing or shortness of breath.  . nystatin cream (MYCOSTATIN) Apply 1 application topically 2 (two) times daily.  . sertraline (ZOLOFT) 100 MG tablet TAKE 1 TABLET BY MOUTH DAILY  . tiotropium (SPIRIVA HANDIHALER) 18 MCG inhalation capsule Place 1 capsule (18 mcg total) into inhaler and inhale daily.   Current Facility-Administered Medications for the 04/28/18 encounter (Office Visit) with Duanne Limerick, MD  Medication  . ipratropium-albuterol (DUONEB) 0.5-2.5 (3) MG/3ML nebulizer solution 3 mL    PHQ 2/9 Scores 04/28/2018 01/17/2018 08/26/2017 10/01/2016  PHQ - 2 Score 0 0 0 0  PHQ- 9 Score - 0 0 -    Physical Exam Vitals signs and nursing note reviewed.  Constitutional:      General: She is not in acute distress.    Appearance: She is not diaphoretic.  HENT:     Head: Normocephalic and atraumatic.     Right Ear: Tympanic membrane, ear canal and external ear normal.     Left Ear: Tympanic membrane, ear canal and external ear normal.     Nose: Nose normal. No congestion or rhinorrhea.     Mouth/Throat:     Mouth: Mucous membranes are moist.     Pharynx: No oropharyngeal exudate or posterior oropharyngeal erythema.  Eyes:     General:        Right eye: No discharge.        Left eye: No discharge.     Conjunctiva/sclera: Conjunctivae normal.     Pupils: Pupils are equal, round, and reactive to light.  Neck:     Musculoskeletal: Normal  range of motion and neck supple.     Thyroid: No thyromegaly.     Vascular: No JVD.  Cardiovascular:     Rate and Rhythm: Normal rate and regular rhythm.     Pulses: Normal pulses.     Heart sounds: Normal heart sounds. No murmur. No friction rub. No gallop.   Pulmonary:     Effort: Pulmonary effort is normal. No respiratory distress.     Breath sounds: No stridor. Wheezing present. No rhonchi or rales.  Abdominal:     General: Bowel sounds are normal.     Palpations: Abdomen is soft. There is no mass.     Tenderness: There is no abdominal tenderness. There is no guarding or rebound.  Musculoskeletal: Normal range of motion.  Lymphadenopathy:     Cervical: No cervical adenopathy.  Skin:    General: Skin is warm and dry.  Neurological:     Mental Status: She is alert.     Deep Tendon Reflexes: Reflexes are normal and symmetric.     Wt Readings from Last 3 Encounters:  04/28/18 141 lb (64 kg)  01/17/18 140 lb (63.5 kg)  08/26/17 145 lb (65.8 kg)    BP 124/78   Pulse 74   Ht 5\' 6"  (1.676 m)   Wt 141 lb (64 kg)   SpO2 93%   BMI 22.76 kg/m   Assessment and Plan: 1. Acute exacerbation of chronic obstructive pulmonary disease (COPD) (HCC) Acute exacerbation on  chronic COPD.  Patient has continued to use her regimen of Advair and Spiriva.  We will add prednisone 10 mg once a day and be given for symptomatic relief guaifenesin with codeine. - predniSONE (DELTASONE) 10 MG tablet; Take 1 tablet (10 mg total) by mouth daily with breakfast.  Dispense: 30 tablet; Refill: 0 - guaiFENesin-codeine (ROBITUSSIN AC) 100-10 MG/5ML syrup; Take 5 mLs by mouth 3 (three) times daily as needed for cough.  Dispense: 118 mL; Refill: 0  2. Candidiasis Patient has a history of getting vulvovaginal candidiasis with antibiotics.  Patient was given refill on nystatin cream. - nystatin cream (MYCOSTATIN); Apply 1 application topically 2 (two) times daily.  Dispense: 30 g; Refill: 0  3. Acute  bronchitis with chronic obstructive pulmonary disease (COPD) (HCC) Patient has an acute bronchitis with her underlying COPD for which we will put her on azithromycin 2 tablets today and 1 tablet a day for 4 days thereafter. - azithromycin (ZITHROMAX) 250 MG tablet; 2 today then 1 a day for 4 days  Dispense: 6 tablet; Refill: 0 The history of recurrent depression and will refill her 4. Recurrent major depressive episodes (HCC) Patient has a history of recurrent depression and will refill her sertraline 100 mg once a day.

## 2018-05-11 DIAGNOSIS — J449 Chronic obstructive pulmonary disease, unspecified: Secondary | ICD-10-CM | POA: Diagnosis not present

## 2018-06-01 DIAGNOSIS — J449 Chronic obstructive pulmonary disease, unspecified: Secondary | ICD-10-CM | POA: Diagnosis not present

## 2018-06-01 DIAGNOSIS — R0609 Other forms of dyspnea: Secondary | ICD-10-CM | POA: Diagnosis not present

## 2018-06-10 DIAGNOSIS — J449 Chronic obstructive pulmonary disease, unspecified: Secondary | ICD-10-CM | POA: Diagnosis not present

## 2018-06-23 DIAGNOSIS — H524 Presbyopia: Secondary | ICD-10-CM | POA: Diagnosis not present

## 2018-07-11 DIAGNOSIS — J449 Chronic obstructive pulmonary disease, unspecified: Secondary | ICD-10-CM | POA: Diagnosis not present

## 2018-08-01 ENCOUNTER — Encounter: Payer: Self-pay | Admitting: Family Medicine

## 2018-08-01 ENCOUNTER — Ambulatory Visit (INDEPENDENT_AMBULATORY_CARE_PROVIDER_SITE_OTHER): Payer: Medicare Other | Admitting: Family Medicine

## 2018-08-01 ENCOUNTER — Other Ambulatory Visit: Payer: Self-pay

## 2018-08-01 VITALS — BP 100/70 | HR 64 | Ht 66.0 in | Wt 140.0 lb

## 2018-08-01 DIAGNOSIS — J441 Chronic obstructive pulmonary disease with (acute) exacerbation: Secondary | ICD-10-CM | POA: Diagnosis not present

## 2018-08-01 DIAGNOSIS — J432 Centrilobular emphysema: Secondary | ICD-10-CM

## 2018-08-01 DIAGNOSIS — F339 Major depressive disorder, recurrent, unspecified: Secondary | ICD-10-CM

## 2018-08-01 MED ORDER — GUAIFENESIN-CODEINE 100-10 MG/5ML PO SYRP: 5.0000 mL | ORAL_SOLUTION | Freq: Three times a day (TID) | ORAL | 0 refills | Status: DC | PRN

## 2018-08-01 MED ORDER — AZITHROMYCIN 250 MG PO TABS: ORAL_TABLET | ORAL | 0 refills | Status: DC

## 2018-08-01 MED ORDER — PREDNISONE 10 MG PO TABS: 10.0000 mg | ORAL_TABLET | Freq: Every day | ORAL | 0 refills | Status: DC

## 2018-08-01 MED ORDER — FLUTICASONE-SALMETEROL 250-50 MCG/DOSE IN AEPB: 1.0000 | INHALATION_SPRAY | Freq: Two times a day (BID) | RESPIRATORY_TRACT | 1 refills | Status: DC

## 2018-08-01 MED ORDER — SERTRALINE HCL 100 MG PO TABS: 100.0000 mg | ORAL_TABLET | Freq: Every day | ORAL | 1 refills | Status: DC

## 2018-08-01 NOTE — Progress Notes (Signed)
Date:  08/01/2018   Name:  Marie Martin   DOB:  [birth year]   MRN:  [id number]   Chief Complaint: Depression and COPD (would like Zpack and prednisone)  Depression        This is a chronic problem.  The current episode started more than 1 year ago.   The onset quality is sudden.   The problem occurs daily.  The problem has been gradually improving (I think I'm doing pretty damn good.") since onset.  Associated symptoms include no decreased concentration, no fatigue, no helplessness, no hopelessness, does not have insomnia, not irritable, no restlessness, no decreased interest, no appetite change, no body aches, no myalgias, no headaches, no indigestion, not sad and no suicidal ideas.     The symptoms are aggravated by nothing.  Past treatments include SSRIs - Selective serotonin reuptake inhibitors.  Compliance with treatment is good.  Previous treatment provided mild relief.  Risk factors include a change in medication usage/dosage.  COPD She complains of cough and shortness of breath. There is no chest tightness, difficulty breathing, frequent throat clearing, hemoptysis, hoarse voice, sputum production or wheezing. This is a new problem. Episode onset: took pred saturday. The problem occurs intermittently. The problem has been waxing and waning. The cough is productive and productive of purulent sputum. Associated symptoms include nasal congestion, postnasal drip and sneezing. Pertinent negatives include no appetite change, chest pain, dyspnea on exertion, ear pain, fever, headaches, myalgias, PND, rhinorrhea or sore throat. Her past medical history is significant for COPD.    Review of Systems  Constitutional: Negative for appetite change, chills, fatigue and fever.  HENT: Positive for postnasal drip and sneezing. Negative for drooling, ear discharge, ear pain, hoarse voice, rhinorrhea and sore throat.   Respiratory: Positive for cough and shortness of breath. Negative for hemoptysis, sputum  production and wheezing.   Cardiovascular: Negative for chest pain, dyspnea on exertion, palpitations, leg swelling and PND.  Gastrointestinal: Negative for abdominal pain, blood in stool, constipation, diarrhea and nausea.  Endocrine: Negative for polydipsia.  Genitourinary: Negative for dysuria, frequency, hematuria and urgency.  Musculoskeletal: Negative for back pain, myalgias and neck pain.  Skin: Negative for rash.  Allergic/Immunologic: Negative for environmental allergies.  Neurological: Negative for dizziness and headaches.  Hematological: Does not bruise/bleed easily.  Psychiatric/Behavioral: Positive for depression. Negative for decreased concentration and suicidal ideas. The patient is not nervous/anxious and does not have insomnia.     Patient Active Problem List   Diagnosis Date Noted  . 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 Recurrent major depressive episodes (HCC) 05/31/2014    Allergies  Allergen Reactions  . Amoxicillin-Pot Clavulanate Nausea Only and Other (See Comments)    Other Reaction: trouble swallowing  . Penicillins     Past Surgical History:  Procedure Laterality Date  . TONSILLECTOMY      Social History   Tobacco Use  . Smoking status: Former Games developermoker  . Smokeless tobacco: Never Used  Substance Use Topics  . Alcohol use: No    Alcohol/week: 0.0 standard drinks  . Drug use: No     Medication list has been reviewed and updated.  Current Meds  Medication Sig  . ADVAIR DISKUS 250-50 MCG/DOSE AEPB USE ONE INHALATION BY MOUTH TWICE DAILY  . albuterol (PROVENTIL HFA;VENTOLIN HFA) 108 (90 Base) MCG/ACT inhaler Inhale 2 puffs into the lungs every 6 (six) hours as needed for wheezing or shortness of breath.  Marland Kitchen. albuterol (PROVENTIL) (2.5 MG/3ML) 0.083% nebulizer solution Take  3 mLs (2.5 mg total) by nebulization every 6 (six) hours as needed for wheezing or shortness of breath.  . nystatin cream (MYCOSTATIN) Apply 1 application topically 2 (two)  times daily.  . sertraline (ZOLOFT) 100 MG tablet TAKE 1 TABLET BY MOUTH DAILY  . tiotropium (SPIRIVA HANDIHALER) 18 MCG inhalation capsule Place 1 capsule (18 mcg total) into inhaler and inhale daily.   Current Facility-Administered Medications for the 08/01/18 encounter (Office Visit) with Juline Patch, MD  Medication  . ipratropium-albuterol (DUONEB) 0.5-2.5 (3) MG/3ML nebulizer solution 3 mL    PHQ 2/9 Scores 08/01/2018 04/28/2018 01/17/2018 08/26/2017  PHQ - 2 Score 0 0 0 0  PHQ- 9 Score 0 - 0 0    BP Readings from Last 3 Encounters:  08/01/18 100/70  04/28/18 124/78  01/17/18 120/70    Physical Exam Vitals signs and nursing note reviewed.  Constitutional:      General: She is not irritable.    Appearance: Normal appearance. She is well-developed and normal weight.  HENT:     Head: Normocephalic.     Right Ear: Tympanic membrane, ear canal and external ear normal.     Left Ear: Tympanic membrane, ear canal and external ear normal.     Nose: Nose normal.     Mouth/Throat:     Mouth: Mucous membranes are dry.  Eyes:     General: Lids are everted, no foreign bodies appreciated. No scleral icterus.       Left eye: No foreign body or hordeolum.     Conjunctiva/sclera: Conjunctivae normal.     Right eye: Right conjunctiva is not injected.     Left eye: Left conjunctiva is not injected.     Pupils: Pupils are equal, round, and reactive to light.  Neck:     Musculoskeletal: Normal range of motion and neck supple.     Thyroid: No thyromegaly.     Vascular: No JVD.     Trachea: No tracheal deviation.  Cardiovascular:     Rate and Rhythm: Normal rate and regular rhythm.     Pulses: Normal pulses.     Heart sounds: Normal heart sounds. No murmur. No friction rub. No gallop.   Pulmonary:     Effort: Pulmonary effort is normal. No respiratory distress.     Breath sounds: Normal breath sounds. No wheezing, rhonchi or rales.  Abdominal:     General: Abdomen is flat. Bowel  sounds are normal.     Palpations: Abdomen is soft. There is no mass.     Tenderness: There is no abdominal tenderness. There is no guarding or rebound.  Musculoskeletal: Normal range of motion.        General: No tenderness.  Lymphadenopathy:     Cervical: No cervical adenopathy.  Skin:    General: Skin is warm.     Findings: No rash.  Neurological:     Mental Status: She is alert and oriented to person, place, and time.     Cranial Nerves: No cranial nerve deficit.     Deep Tendon Reflexes: Reflexes normal.  Psychiatric:        Mood and Affect: Mood is not anxious or depressed.     Wt Readings from Last 3 Encounters:  08/01/18 140 lb (63.5 kg)  04/28/18 141 lb (64 kg)  01/17/18 140 lb (63.5 kg)    BP 100/70   Pulse 64   Ht 5\' 6"  (1.676 m)   Wt 140 lb (63.5 kg)   SpO2  95%   BMI 22.60 kg/m   Assessment and Plan:  1. Acute exacerbation of chronic obstructive pulmonary disease (COPD) (HCC) New onset.  Patient has an exacerbation of COPD for which we will refill her inhalers as well as initiate prednisone 1 a day for 2 weeks.  Patient was given guaifenesin with codeine for cough control.  Will proceed with a azithromycin 250 mg because there is likely a bronchitis associated with this acute exacerbation. - azithromycin (ZITHROMAX) 250 MG tablet; 2 today then 1 a day for 4 day  Dispense: 6 tablet; Refill: 0 - predniSONE (DELTASONE) 10 MG tablet; Take 1 tablet (10 mg total) by mouth daily with breakfast.  Dispense: 30 tablet; Refill: 0 - guaiFENesin-codeine (ROBITUSSIN AC) 100-10 MG/5ML syrup; Take 5 mLs by mouth 3 (three) times daily as needed for cough.  Dispense: 118 mL; Refill: 0  2. Centriacinar emphysema (HCC) Chronic.  Exacerbation.  Will refill her Advair Diskus to supplement her Spiriva and albuterol rescue inhaler. - Fluticasone-Salmeterol (ADVAIR DISKUS) 250-50 MCG/DOSE AEPB; Inhale 1 puff into the lungs 2 (two) times daily.  Dispense: 180 each; Refill: 1  3.  Recurrent major depressive episodes (HCC) Chronic.  Controlled.  PHQ 0 we will continue sertraline 100 mg once a day.  And recheck in 6 months. - sertraline (ZOLOFT) 100 MG tablet; Take 1 tablet (100 mg total) by mouth daily.  Dispense: 90 tablet; Refill: 1

## 2018-08-10 DIAGNOSIS — J449 Chronic obstructive pulmonary disease, unspecified: Secondary | ICD-10-CM | POA: Diagnosis not present

## 2018-09-10 DIAGNOSIS — J449 Chronic obstructive pulmonary disease, unspecified: Secondary | ICD-10-CM | POA: Diagnosis not present

## 2018-10-03 ENCOUNTER — Ambulatory Visit (INDEPENDENT_AMBULATORY_CARE_PROVIDER_SITE_OTHER): Payer: Medicare Other

## 2018-10-03 ENCOUNTER — Other Ambulatory Visit: Payer: Self-pay

## 2018-10-03 DIAGNOSIS — Z23 Encounter for immunization: Secondary | ICD-10-CM | POA: Diagnosis not present

## 2018-10-03 NOTE — Progress Notes (Signed)
Flu shot given

## 2018-10-11 DIAGNOSIS — J449 Chronic obstructive pulmonary disease, unspecified: Secondary | ICD-10-CM | POA: Diagnosis not present

## 2018-11-03 DIAGNOSIS — R0600 Dyspnea, unspecified: Secondary | ICD-10-CM | POA: Diagnosis not present

## 2018-11-03 DIAGNOSIS — J449 Chronic obstructive pulmonary disease, unspecified: Secondary | ICD-10-CM | POA: Diagnosis not present

## 2018-11-10 DIAGNOSIS — J449 Chronic obstructive pulmonary disease, unspecified: Secondary | ICD-10-CM | POA: Diagnosis not present

## 2018-12-11 DIAGNOSIS — J449 Chronic obstructive pulmonary disease, unspecified: Secondary | ICD-10-CM | POA: Diagnosis not present

## 2019-01-10 DIAGNOSIS — J449 Chronic obstructive pulmonary disease, unspecified: Secondary | ICD-10-CM | POA: Diagnosis not present

## 2019-02-10 DIAGNOSIS — J449 Chronic obstructive pulmonary disease, unspecified: Secondary | ICD-10-CM | POA: Diagnosis not present

## 2019-02-14 ENCOUNTER — Ambulatory Visit (INDEPENDENT_AMBULATORY_CARE_PROVIDER_SITE_OTHER): Payer: Medicare Other | Admitting: Family Medicine

## 2019-02-14 ENCOUNTER — Encounter: Payer: Self-pay | Admitting: Family Medicine

## 2019-02-14 ENCOUNTER — Other Ambulatory Visit: Payer: Self-pay

## 2019-02-14 VITALS — BP 124/80 | HR 72 | Ht 66.0 in | Wt 141.0 lb

## 2019-02-14 DIAGNOSIS — J441 Chronic obstructive pulmonary disease with (acute) exacerbation: Secondary | ICD-10-CM | POA: Diagnosis not present

## 2019-02-14 DIAGNOSIS — F339 Major depressive disorder, recurrent, unspecified: Secondary | ICD-10-CM

## 2019-02-14 DIAGNOSIS — J432 Centrilobular emphysema: Secondary | ICD-10-CM | POA: Diagnosis not present

## 2019-02-14 DIAGNOSIS — J0100 Acute maxillary sinusitis, unspecified: Secondary | ICD-10-CM

## 2019-02-14 MED ORDER — GUAIFENESIN-CODEINE 100-10 MG/5ML PO SYRP: 5.0000 mL | ORAL_SOLUTION | Freq: Three times a day (TID) | ORAL | 0 refills | Status: DC | PRN

## 2019-02-14 MED ORDER — SPIRIVA HANDIHALER 18 MCG IN CAPS: 1.0000 | ORAL_CAPSULE | Freq: Every day | RESPIRATORY_TRACT | 1 refills | Status: DC

## 2019-02-14 MED ORDER — ALBUTEROL SULFATE (2.5 MG/3ML) 0.083% IN NEBU: 2.5000 mg | INHALATION_SOLUTION | Freq: Four times a day (QID) | RESPIRATORY_TRACT | 12 refills | Status: DC | PRN

## 2019-02-14 MED ORDER — PREDNISONE 10 MG PO TABS: 10.0000 mg | ORAL_TABLET | Freq: Every day | ORAL | 0 refills | Status: DC

## 2019-02-14 MED ORDER — AZITHROMYCIN 250 MG PO TABS: ORAL_TABLET | ORAL | 0 refills | Status: DC

## 2019-02-14 MED ORDER — ALBUTEROL SULFATE HFA 108 (90 BASE) MCG/ACT IN AERS: 2.0000 | INHALATION_SPRAY | Freq: Four times a day (QID) | RESPIRATORY_TRACT | 1 refills | Status: DC | PRN

## 2019-02-14 MED ORDER — FLUTICASONE-SALMETEROL 250-50 MCG/DOSE IN AEPB: 1.0000 | INHALATION_SPRAY | Freq: Two times a day (BID) | RESPIRATORY_TRACT | 1 refills | Status: DC

## 2019-02-14 MED ORDER — SERTRALINE HCL 100 MG PO TABS: 100.0000 mg | ORAL_TABLET | Freq: Every day | ORAL | 1 refills | Status: DC

## 2019-02-14 NOTE — Progress Notes (Signed)
Date:  02/14/2019   Name:  Marie Martin   DOB:  10/03/1931   MRN:  [id number]   Chief Complaint: Depression (PHQ9=2 and GAD7=1), COPD, and Sinusitis (congestion- feels loose, but afraid it will get tight)  Depression        This is a chronic problem.  The current episode started more than 1 year ago.   The onset quality is gradual.   The problem occurs intermittently.  The problem has been gradually improving since onset.  Associated symptoms include fatigue and helplessness.  Associated symptoms include no decreased concentration, no hopelessness, does not have insomnia, not irritable, no restlessness, no decreased interest, no appetite change, no myalgias, no headaches, no indigestion, not sad and no suicidal ideas.  Past treatments include SSRIs - Selective serotonin reuptake inhibitors.  Compliance with treatment is good.  Previous treatment provided moderate relief. COPD She complains of cough, frequent throat clearing, shortness of breath and wheezing. There is no chest tightness, difficulty breathing, hemoptysis, hoarse voice or sputum production. This is a chronic problem. The current episode started more than 1 year ago. The problem occurs intermittently. The problem has been waxing and waning. The cough is productive of purulent sputum. Associated symptoms include dyspnea on exertion and postnasal drip. Pertinent negatives include no appetite change, chest pain, ear congestion, ear pain, fever, headaches, heartburn, malaise/fatigue, myalgias, nasal congestion, orthopnea, PND, rhinorrhea, sneezing, sore throat, sweats, trouble swallowing or weight loss. Her symptoms are aggravated by URI. Her symptoms are alleviated by beta-agonist and steroid inhaler. She reports moderate improvement on treatment. Her past medical history is significant for COPD.  Sinusitis This is a chronic problem. The current episode started in the past 7 days. Associated symptoms include coughing and shortness of  breath. Pertinent negatives include no chills, ear pain, headaches, hoarse voice, neck pain, sneezing or sore throat. The treatment provided moderate relief.    Lab Results  Component Value Date   CREATININE 1.08 (H) 12/02/2015   BUN 15 12/02/2015   NA 143 12/02/2015   K 4.7 12/02/2015   CL 104 12/02/2015   CO2 25 12/02/2015   Lab Results  Component Value Date   CHOL 232 (H) 12/02/2015   HDL 62 12/02/2015   LDLCALC 141 (H) 12/02/2015   TRIG 143 12/02/2015   CHOLHDL 3.7 12/02/2015   No results found for: TSH No results found for: HGBA1C   Review of Systems  Constitutional: Positive for fatigue. Negative for appetite change, chills, fever, malaise/fatigue and weight loss.  HENT: Positive for postnasal drip. Negative for drooling, ear discharge, ear pain, hoarse voice, rhinorrhea, sneezing, sore throat and trouble swallowing.   Respiratory: Positive for cough, shortness of breath and wheezing. Negative for hemoptysis and sputum production.   Cardiovascular: Positive for dyspnea on exertion. Negative for chest pain, palpitations, leg swelling and PND.  Gastrointestinal: Negative for abdominal pain, blood in stool, constipation, diarrhea, heartburn and nausea.  Endocrine: Negative for polydipsia.  Genitourinary: Negative for dysuria, frequency, hematuria and urgency.  Musculoskeletal: Negative for back pain, myalgias and neck pain.  Skin: Negative for rash.  Allergic/Immunologic: Negative for environmental allergies.  Neurological: Negative for dizziness and headaches.  Hematological: Does not bruise/bleed easily.  Psychiatric/Behavioral: Positive for depression. Negative for decreased concentration and suicidal ideas. The patient is not nervous/anxious and does not have insomnia.     Patient Active Problem List   Diagnosis Date Noted  . 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 Recurrent major depressive episodes (HCC) 05/31/2014  Allergies  Allergen Reactions  .  Amoxicillin-Pot Clavulanate Nausea Only and Other (See Comments)    Other Reaction: trouble swallowing  . Penicillins     Past Surgical History:  Procedure Laterality Date  . TONSILLECTOMY      Social History   Tobacco Use  . Smoking status: Former Games developer  . Smokeless tobacco: Never Used  Substance Use Topics  . Alcohol use: No    Alcohol/week: 0.0 standard drinks  . Drug use: No     Medication list has been reviewed and updated.  Current Meds  Medication Sig  . albuterol (PROVENTIL) (2.5 MG/3ML) 0.083% nebulizer solution Take 3 mLs (2.5 mg total) by nebulization every 6 (six) hours as needed for wheezing or shortness of breath.  Marland Kitchen albuterol (VENTOLIN HFA) 108 (90 Base) MCG/ACT inhaler Inhale 2 puffs into the lungs every 6 (six) hours as needed for wheezing or shortness of breath.  . Fluticasone-Salmeterol (ADVAIR DISKUS) 250-50 MCG/DOSE AEPB Inhale 1 puff into the lungs 2 (two) times daily.  . sertraline (ZOLOFT) 100 MG tablet Take 1 tablet (100 mg total) by mouth daily.  Marland Kitchen tiotropium (SPIRIVA HANDIHALER) 18 MCG inhalation capsule Place 1 capsule (18 mcg total) into inhaler and inhale daily.  . [DISCONTINUED] albuterol (PROVENTIL HFA;VENTOLIN HFA) 108 (90 Base) MCG/ACT inhaler Inhale 2 puffs into the lungs every 6 (six) hours as needed for wheezing or shortness of breath.  . [DISCONTINUED] albuterol (PROVENTIL) (2.5 MG/3ML) 0.083% nebulizer solution Take 3 mLs (2.5 mg total) by nebulization every 6 (six) hours as needed for wheezing or shortness of breath.  . [DISCONTINUED] Fluticasone-Salmeterol (ADVAIR DISKUS) 250-50 MCG/DOSE AEPB Inhale 1 puff into the lungs 2 (two) times daily.  . [DISCONTINUED] sertraline (ZOLOFT) 100 MG tablet Take 1 tablet (100 mg total) by mouth daily.  . [DISCONTINUED] tiotropium (SPIRIVA HANDIHALER) 18 MCG inhalation capsule Place 1 capsule (18 mcg total) into inhaler and inhale daily.   Current Facility-Administered Medications for the 02/14/19  encounter (Office Visit) with Duanne Limerick, MD  Medication  . ipratropium-albuterol (DUONEB) 0.5-2.5 (3) MG/3ML nebulizer solution 3 mL    PHQ 2/9 Scores 02/14/2019 08/01/2018 04/28/2018 01/17/2018  PHQ - 2 Score 2 0 0 0  PHQ- 9 Score 2 0 - 0    BP Readings from Last 3 Encounters:  02/14/19 124/80  08/01/18 100/70  04/28/18 124/78    Physical Exam Vitals and nursing note reviewed.  Constitutional:      General: She is not irritable.    Appearance: She is well-developed.  HENT:     Head: Normocephalic.     Right Ear: Tympanic membrane, ear canal and external ear normal.     Left Ear: Tympanic membrane, ear canal and external ear normal.     Nose: Nose normal. No congestion or rhinorrhea.     Mouth/Throat:     Mouth: Mucous membranes are moist.  Eyes:     General: Lids are everted, no foreign bodies appreciated. No scleral icterus.       Left eye: No foreign body or hordeolum.     Conjunctiva/sclera: Conjunctivae normal.     Right eye: Right conjunctiva is not injected.     Left eye: Left conjunctiva is not injected.     Pupils: Pupils are equal, round, and reactive to light.  Neck:     Thyroid: No thyromegaly.     Vascular: No JVD.     Trachea: No tracheal deviation.  Cardiovascular:     Rate and Rhythm: Normal rate  and regular rhythm.     Heart sounds: Normal heart sounds. No murmur. No friction rub. No gallop.   Pulmonary:     Effort: Pulmonary effort is normal. No respiratory distress.     Breath sounds: Normal breath sounds. No wheezing, rhonchi or rales.  Abdominal:     General: Bowel sounds are normal.     Palpations: Abdomen is soft. There is no mass.     Tenderness: There is no abdominal tenderness. There is no guarding or rebound.  Musculoskeletal:        General: No tenderness. Normal range of motion.     Cervical back: Normal range of motion and neck supple.  Lymphadenopathy:     Cervical: No cervical adenopathy.  Skin:    General: Skin is warm.      Findings: No rash.  Neurological:     Mental Status: She is alert and oriented to person, place, and time.     Cranial Nerves: No cranial nerve deficit.     Deep Tendon Reflexes: Reflexes normal.  Psychiatric:        Mood and Affect: Mood is not anxious or depressed.     Wt Readings from Last 3 Encounters:  02/14/19 141 lb (64 kg)  08/01/18 140 lb (63.5 kg)  04/28/18 141 lb (64 kg)    BP 124/80   Pulse 72   Ht 5\' 6"  (1.676 m)   Wt 141 lb (64 kg)   SpO2 95%   BMI 22.76 kg/m   Assessment and Plan:  1. Chronic obstructive pulmonary disease with acute exacerbation (HCC) Chronic.  Controlled.  With acute exacerbation today.  We will continue albuterol inhaler 2 puffs every 6 hours as needed wheezing.  We will also provide albuterol as a alternative to be used in nebulization to the above H FA preparation.  We will also provide 10 mg of prednisone 1 a day to reduce inflammation and also provide Robitussin-AC as needed cough. - albuterol (VENTOLIN HFA) 108 (90 Base) MCG/ACT inhaler; Inhale 2 puffs into the lungs every 6 (six) hours as needed for wheezing or shortness of breath.  Dispense: 18 g; Refill: 1 - albuterol (PROVENTIL) (2.5 MG/3ML) 0.083% nebulizer solution; Take 3 mLs (2.5 mg total) by nebulization every 6 (six) hours as needed for wheezing or shortness of breath.  Dispense: 75 mL; Refill: 12 - predniSONE (DELTASONE) 10 MG tablet; Take 1 tablet (10 mg total) by mouth daily with breakfast.  Dispense: 30 tablet; Refill: 0 - guaiFENesin-codeine (ROBITUSSIN AC) 100-10 MG/5ML syrup; Take 5 mLs by mouth 3 (three) times daily as needed for cough.  Dispense: 118 mL; Refill: 0 - Renal Function Panel  2. Centriacinar emphysema (HCC) Chronic.  Controlled.  Stable.  Will continue Advair Diskus 250-51 puff twice a day and Spiriva 1 capsule daily by inhalation.  Will check renal function panel to evaluate GFR. - Fluticasone-Salmeterol (ADVAIR DISKUS) 250-50 MCG/DOSE AEPB; Inhale 1 puff into  the lungs 2 (two) times daily.  Dispense: 180 each; Refill: 1 - tiotropium (SPIRIVA HANDIHALER) 18 MCG inhalation capsule; Place 1 capsule (18 mcg total) into inhaler and inhale daily.  Dispense: 90 capsule; Refill: 1 - Renal Function Panel  3. Recurrent major depressive episodes (HCC) Chronic.  Controlled.  Stable.  PHQ score is 2.  Gad score is 1.  We will continue sertraline 100 mg 1 a day.  Will recheck in 6 months - sertraline (ZOLOFT) 100 MG tablet; Take 1 tablet (100 mg total) by mouth daily.  Dispense: 90 tablet; Refill: 1  4. Acute maxillary sinusitis, recurrence not specified Acute.  Recurrent.  Stable.  Patient is having discomfort and subjective presentation of previous maxillary sinusitis.  In the past patient has been treated with success on azithromycin to 50 mg 2 today followed by 1 a day for 4 days prednisone 10 mg daily and Robitussin-AC - azithromycin (ZITHROMAX) 250 MG tablet; 2 today then 1 a day for 4 day  Dispense: 6 tablet; Refill: 0 - predniSONE (DELTASONE) 10 MG tablet; Take 1 tablet (10 mg total) by mouth daily with breakfast.  Dispense: 30 tablet; Refill: 0 - guaiFENesin-codeine (ROBITUSSIN AC) 100-10 MG/5ML syrup; Take 5 mLs by mouth 3 (three) times daily as needed for cough.  Dispense: 118 mL; Refill: 0

## 2019-02-15 LAB — RENAL FUNCTION PANEL
Albumin: 4.1 g/dL (ref 3.6–4.6)
BUN/Creatinine Ratio: 20 (ref 12–28)
BUN: 25 mg/dL (ref 8–27)
CO2: 23 mmol/L (ref 20–29)
Calcium: 9.1 mg/dL (ref 8.7–10.3)
Chloride: 105 mmol/L (ref 96–106)
Creatinine, Ser: 1.24 mg/dL — ABNORMAL HIGH (ref 0.57–1.00)
GFR calc Af Amer: 46 mL/min/{1.73_m2} — ABNORMAL LOW (ref >59)
GFR calc non Af Amer: 40 mL/min/{1.73_m2} — ABNORMAL LOW (ref >59)
Glucose: 94 mg/dL (ref 65–99)
Phosphorus: 3.7 mg/dL (ref 3.0–4.3)
Potassium: 5.4 mmol/L — ABNORMAL HIGH (ref 3.5–5.2)
Sodium: 141 mmol/L (ref 134–144)

## 2019-03-13 DIAGNOSIS — J449 Chronic obstructive pulmonary disease, unspecified: Secondary | ICD-10-CM | POA: Diagnosis not present

## 2019-04-10 DIAGNOSIS — J449 Chronic obstructive pulmonary disease, unspecified: Secondary | ICD-10-CM | POA: Diagnosis not present

## 2019-04-18 ENCOUNTER — Telehealth: Payer: Self-pay

## 2019-04-18 NOTE — Telephone Encounter (Signed)
Called [facility] for pt in [place] and sched her 2 vaccine appts. First is on 04/19/2019 @ 8:30 and 05/17/2019 @ 8:15- pt notified

## 2019-04-19 ENCOUNTER — Other Ambulatory Visit: Payer: Self-pay

## 2019-04-19 ENCOUNTER — Ambulatory Visit (INDEPENDENT_AMBULATORY_CARE_PROVIDER_SITE_OTHER): Payer: Medicare Other | Admitting: Family Medicine

## 2019-04-19 ENCOUNTER — Encounter: Payer: Self-pay | Admitting: Family Medicine

## 2019-04-19 VITALS — BP 110/70 | HR 72 | Temp 98.5°F | Ht 66.0 in | Wt 139.0 lb

## 2019-04-19 DIAGNOSIS — J441 Chronic obstructive pulmonary disease with (acute) exacerbation: Secondary | ICD-10-CM | POA: Diagnosis not present

## 2019-04-19 DIAGNOSIS — J0100 Acute maxillary sinusitis, unspecified: Secondary | ICD-10-CM | POA: Diagnosis not present

## 2019-04-19 MED ORDER — AZITHROMYCIN 250 MG PO TABS: ORAL_TABLET | ORAL | 0 refills | Status: DC

## 2019-04-19 MED ORDER — PREDNISONE 10 MG PO TABS: 10.0000 mg | ORAL_TABLET | Freq: Every day | ORAL | 0 refills | Status: DC

## 2019-04-19 NOTE — Progress Notes (Signed)
Date:  04/19/2019   Name:  Marie Martin   DOB:  02/23/1931   MRN:  [id number]   Chief Complaint: Sinusitis (started with green production x 2 days ago) and COPD  Sinusitis This is a new problem. The current episode started in the past 7 days. The problem has been gradually improving since onset. There has been no fever. The pain is moderate. Associated symptoms include shortness of breath. Pertinent negatives include no chills, congestion, coughing, diaphoresis, ear pain, headaches, hoarse voice, neck pain, sinus pressure, sneezing, sore throat or swollen glands. Past treatments include nothing. The treatment provided moderate relief.  COPD She complains of shortness of breath, sputum production and wheezing. There is no chest tightness, cough, difficulty breathing, frequent throat clearing, hemoptysis or hoarse voice. This is a recurrent problem. The current episode started more than 1 year ago. The problem has been gradually worsening. Associated symptoms include dyspnea on exertion, nasal congestion and postnasal drip. Pertinent negatives include no appetite change, chest pain, ear congestion, ear pain, fever, headaches, heartburn, malaise/fatigue, myalgias, orthopnea, PND, rhinorrhea, sneezing or sore throat. Her symptoms are aggravated by change in weather (exposure to child). Her symptoms are not alleviated by beta-agonist. Her past medical history is significant for COPD.    Lab Results  Component Value Date   CREATININE 1.24 (H) 02/14/2019   BUN 25 02/14/2019   NA 141 02/14/2019   K 5.4 (H) 02/14/2019   CL 105 02/14/2019   CO2 23 02/14/2019   Lab Results  Component Value Date   CHOL 232 (H) 12/02/2015   HDL 62 12/02/2015   LDLCALC 141 (H) 12/02/2015   TRIG 143 12/02/2015   CHOLHDL 3.7 12/02/2015   No results found for: TSH No results found for: HGBA1C   Review of Systems  Constitutional: Negative.  Negative for appetite change, chills, diaphoresis, fatigue, fever,  malaise/fatigue and unexpected weight change.  HENT: Positive for postnasal drip. Negative for congestion, ear discharge, ear pain, hoarse voice, rhinorrhea, sinus pressure, sneezing and sore throat.   Eyes: Negative for photophobia, pain, discharge, redness and itching.  Respiratory: Positive for sputum production, shortness of breath and wheezing. Negative for cough, hemoptysis and stridor.   Cardiovascular: Positive for dyspnea on exertion. Negative for chest pain and PND.  Gastrointestinal: Negative for abdominal pain, blood in stool, constipation, diarrhea, heartburn, nausea and vomiting.  Endocrine: Negative for cold intolerance, heat intolerance, polydipsia, polyphagia and polyuria.  Genitourinary: Negative for dysuria, flank pain, frequency, hematuria, menstrual problem, pelvic pain, urgency, vaginal bleeding and vaginal discharge.  Musculoskeletal: Negative for arthralgias, back pain, myalgias and neck pain.  Skin: Negative for rash.  Allergic/Immunologic: Negative for environmental allergies and food allergies.  Neurological: Negative for dizziness, weakness, light-headedness, numbness and headaches.  Hematological: Negative for adenopathy. Does not bruise/bleed easily.  Psychiatric/Behavioral: Negative for dysphoric mood. The patient is not nervous/anxious.     Patient Active Problem List   Diagnosis Date Noted  . Centriacinar emphysema ([place]) 05/31/2014  . Recurrent major depressive episodes ([place]) 05/31/2014    Allergies  Allergen Reactions  . Amoxicillin-Pot Clavulanate Nausea Only and Other (See Comments)    Other Reaction: trouble swallowing  . Penicillins     Past Surgical History:  Procedure Laterality Date  . TONSILLECTOMY      Social History   Tobacco Use  . Smoking status: Former Research scientist (life sciences)  . Smokeless tobacco: Never Used  Substance Use Topics  . Alcohol use: No    Alcohol/week: 0.0 standard drinks  .  Drug use: No     Medication list has been reviewed and  updated.  Current Meds  Medication Sig  . albuterol (PROVENTIL) (2.5 MG/3ML) 0.083% nebulizer solution Take 3 mLs (2.5 mg total) by nebulization every 6 (six) hours as needed for wheezing or shortness of breath.  Marland Kitchen albuterol (VENTOLIN HFA) 108 (90 Base) MCG/ACT inhaler Inhale 2 puffs into the lungs every 6 (six) hours as needed for wheezing or shortness of breath.  . Fluticasone-Salmeterol (ADVAIR DISKUS) 250-50 MCG/DOSE AEPB Inhale 1 puff into the lungs 2 (two) times daily.  . sertraline (ZOLOFT) 100 MG tablet Take 1 tablet (100 mg total) by mouth daily.  Marland Kitchen tiotropium (SPIRIVA HANDIHALER) 18 MCG inhalation capsule Place 1 capsule (18 mcg total) into inhaler and inhale daily.   Current Facility-Administered Medications for the 04/19/19 encounter (Office Visit) with Duanne Limerick, MD  Medication  . ipratropium-albuterol (DUONEB) 0.5-2.5 (3) MG/3ML nebulizer solution 3 mL    PHQ 2/9 Scores 02/14/2019 08/01/2018 04/28/2018 01/17/2018  PHQ - 2 Score 2 0 0 0  PHQ- 9 Score 2 0 - 0    BP Readings from Last 3 Encounters:  04/19/19 110/70  02/14/19 124/80  08/01/18 100/70    Physical Exam Vitals and nursing note reviewed.  Constitutional:      General: She is not in acute distress.    Appearance: She is not diaphoretic.  HENT:     Head: Normocephalic and atraumatic.     Right Ear: Tympanic membrane, ear canal and external ear normal.     Left Ear: Tympanic membrane, ear canal and external ear normal.     Nose: Nose normal.  Eyes:     General:        Right eye: No discharge.        Left eye: No discharge.     Conjunctiva/sclera: Conjunctivae normal.     Pupils: Pupils are equal, round, and reactive to light.  Neck:     Thyroid: No thyromegaly.     Vascular: No JVD.  Cardiovascular:     Rate and Rhythm: Normal rate and regular rhythm.     Heart sounds: Normal heart sounds. No murmur. No friction rub. No gallop.   Pulmonary:     Effort: Pulmonary effort is normal.     Breath sounds:  Normal breath sounds. No wheezing or rhonchi.  Abdominal:     General: Bowel sounds are normal.     Palpations: Abdomen is soft. There is no mass.     Tenderness: There is no abdominal tenderness. There is no guarding.  Musculoskeletal:        General: Normal range of motion.     Cervical back: Normal range of motion and neck supple.  Lymphadenopathy:     Cervical: No cervical adenopathy.  Skin:    General: Skin is warm and dry.  Neurological:     Mental Status: She is alert.     Deep Tendon Reflexes: Reflexes are normal and symmetric.     Wt Readings from Last 3 Encounters:  04/19/19 139 lb (63 kg)  02/14/19 141 lb (64 kg)  08/01/18 140 lb (63.5 kg)    BP 110/70   Pulse 72   Temp 98.5 F (36.9 C) (Oral)   Ht 5\' 6"  (1.676 m)   Wt 139 lb (63 kg)   SpO2 94%   BMI 22.44 kg/m   Assessment and Plan: 1. Acute maxillary sinusitis, recurrence not specified Acute.  Persistent.  Stable.  Will initiate  a azithromycin 250 mg 2 today followed by 1 a day for 4 days. - azithromycin (ZITHROMAX) 250 MG tablet; 2 today then 1 a day for 4 day  Dispense: 6 tablet; Refill: 0 - predniSONE (DELTASONE) 10 MG tablet; Take 1 tablet (10 mg total) by mouth daily with breakfast.  Dispense: 30 tablet; Refill: 0  2. Chronic obstructive pulmonary disease with acute exacerbation (HCC) Chronic.    Acute exacerbation.  Stable.  Patient with chronic obstructive pulmonary disease with an acute exacerbation.  We will begin prednisone 10 mg once a day. - predniSONE (DELTASONE) 10 MG tablet; Take 1 tablet (10 mg total) by mouth daily with breakfast.  Dispense: 30 tablet; Refill: 0

## 2019-04-19 NOTE — Addendum Note (Signed)
Addended by: Duanne Limerick on: 04/19/2019 12:43 PM   Modules accepted: Level of Service

## 2019-05-11 DIAGNOSIS — J449 Chronic obstructive pulmonary disease, unspecified: Secondary | ICD-10-CM | POA: Diagnosis not present

## 2019-05-16 DIAGNOSIS — R0600 Dyspnea, unspecified: Secondary | ICD-10-CM | POA: Diagnosis not present

## 2019-05-16 DIAGNOSIS — J449 Chronic obstructive pulmonary disease, unspecified: Secondary | ICD-10-CM | POA: Diagnosis not present

## 2019-05-23 ENCOUNTER — Telehealth: Payer: Self-pay | Admitting: Family Medicine

## 2019-05-23 NOTE — Telephone Encounter (Signed)
Left message for patient to call back and schedule Medicare Annual Wellness Visit (AWV) either virtually/audio only or in office. Whichever the patients preference is.  No history of AWV PER [place]; please schedule at anytime with [facility]-Nurse Health Advisor. 

## 2019-05-24 ENCOUNTER — Ambulatory Visit (INDEPENDENT_AMBULATORY_CARE_PROVIDER_SITE_OTHER): Payer: Medicare Other

## 2019-05-24 DIAGNOSIS — Z0000 Encounter for general adult medical examination without abnormal findings: Secondary | ICD-10-CM | POA: Diagnosis not present

## 2019-05-24 NOTE — Patient Instructions (Signed)
Marie Martin , Thank you for taking time to come for your Medicare Wellness Visit. I appreciate your ongoing commitment to your health goals. Please review the following plan we discussed and let me know if I can assist you in the future.   Screening recommendations/referrals: Colonoscopy: no longer required Mammogram: no longer required Bone Density: no longer required Recommended yearly ophthalmology/optometry visit for glaucoma screening and checkup Recommended yearly dental visit for hygiene and checkup  Vaccinations: Influenza vaccine: done 10/03/18 Pneumococcal vaccine: done 10/01/16 Tdap vaccine: due Shingles vaccine: Shingrix discussed. Please contact your pharmacy for coverage information.    Covid-19: done 04/19/19 & 05/17/19  Advanced directives: Advance directive discussed with you today. I have provided a copy for you to complete at home and have notarized. Once this is complete please bring a copy in to our office so we can scan it into your chart.  Conditions/risks identified: Keep up the great work!  Next appointment: Please follow up in one year for your Medicare Annual Wellness visit.     Preventive Care 79 Years and Older, Female Preventive care refers to lifestyle choices and visits with your health care provider that can promote health and wellness. What does preventive care include?  A yearly physical exam. This is also called an annual well check.  Dental exams once or twice a year.  Routine eye exams. Ask your health care provider how often you should have your eyes checked.  Personal lifestyle choices, including:  Daily care of your teeth and gums.  Regular physical activity.  Eating a healthy diet.  Avoiding tobacco and drug use.  Limiting alcohol use.  Practicing safe sex.  Taking low-dose aspirin every day.  Taking vitamin and mineral supplements as recommended by your health care provider. What happens during an annual well check? The services  and screenings done by your health care provider during your annual well check will depend on your age, overall health, lifestyle risk factors, and family history of disease. Counseling  Your health care provider may ask you questions about your:  Alcohol use.  Tobacco use.  Drug use.  Emotional well-being.  Home and relationship well-being.  Sexual activity.  Eating habits.  History of falls.  Memory and ability to understand (cognition).  Work and work Astronomer.  Reproductive health. Screening  You may have the following tests or measurements:  Height, weight, and BMI.  Blood pressure.  Lipid and cholesterol levels. These may be checked every 5 years, or more frequently if you are over 102 years old.  Skin check.  Lung cancer screening. You may have this screening every year starting at age 72 if you have a 30-pack-year history of smoking and currently smoke or have quit within the past 15 years.  Fecal occult blood test (FOBT) of the stool. You may have this test every year starting at age 92.  Flexible sigmoidoscopy or colonoscopy. You may have a sigmoidoscopy every 5 years or a colonoscopy every 10 years starting at age 32.  Hepatitis C blood test.  Hepatitis B blood test.  Sexually transmitted disease (STD) testing.  Diabetes screening. This is done by checking your blood sugar (glucose) after you have not eaten for a while (fasting). You may have this done every 1-3 years.  Bone density scan. This is done to screen for osteoporosis. You may have this done starting at age 8.  Mammogram. This may be done every 1-2 years. Talk to your health care provider about how often you should  have regular mammograms. Talk with your health care provider about your test results, treatment options, and if necessary, the need for more tests. Vaccines  Your health care provider may recommend certain vaccines, such as:  Influenza vaccine. This is recommended every  year.  Tetanus, diphtheria, and acellular pertussis (Tdap, Td) vaccine. You may need a Td booster every 10 years.  Zoster vaccine. You may need this after age 64.  Pneumococcal 13-valent conjugate (PCV13) vaccine. One dose is recommended after age 7.  Pneumococcal polysaccharide (PPSV23) vaccine. One dose is recommended after age 67. Talk to your health care provider about which screenings and vaccines you need and how often you need them. This information is not intended to replace advice given to you by your health care provider. Make sure you discuss any questions you have with your health care provider. Document Released: 02/22/2015 Document Revised: 10/16/2015 Document Reviewed: 11/27/2014 Elsevier Interactive Patient Education  2017 [place] Prevention in the Home Falls can cause injuries. They can happen to people of all ages. There are many things you can do to make your home safe and to help prevent falls. What can I do on the outside of my home?  Regularly fix the edges of walkways and driveways and fix any cracks.  Remove anything that might make you trip as you walk through a door, such as a raised step or threshold.  Trim any bushes or trees on the path to your home.  Use bright outdoor lighting.  Clear any walking paths of anything that might make someone trip, such as rocks or tools.  Regularly check to see if handrails are loose or broken. Make sure that both sides of any steps have handrails.  Any raised decks and porches should have guardrails on the edges.  Have any leaves, snow, or ice cleared regularly.  Use sand or salt on walking paths during winter.  Clean up any spills in your garage right away. This includes oil or grease spills. What can I do in the bathroom?  Use night lights.  Install grab bars by the toilet and in the tub and shower. Do not use towel bars as grab bars.  Use non-skid mats or decals in the tub or shower.  If you  need to sit down in the shower, use a plastic, non-slip stool.  Keep the floor dry. Clean up any water that spills on the floor as soon as it happens.  Remove soap buildup in the tub or shower regularly.  Attach bath mats securely with double-sided non-slip rug tape.  Do not have throw rugs and other things on the floor that can make you trip. What can I do in the bedroom?  Use night lights.  Make sure that you have a light by your bed that is easy to reach.  Do not use any sheets or blankets that are too big for your bed. They should not hang down onto the floor.  Have a firm chair that has side arms. You can use this for support while you get dressed.  Do not have throw rugs and other things on the floor that can make you trip. What can I do in the kitchen?  Clean up any spills right away.  Avoid walking on wet floors.  Keep items that you use a lot in easy-to-reach places.  If you need to reach something above you, use a strong step stool that has a grab bar.  Keep electrical cords out of  the way.  Do not use floor polish or wax that makes floors slippery. If you must use wax, use non-skid floor wax.  Do not have throw rugs and other things on the floor that can make you trip. What can I do with my stairs?  Do not leave any items on the stairs.  Make sure that there are handrails on both sides of the stairs and use them. Fix handrails that are broken or loose. Make sure that handrails are as long as the stairways.  Check any carpeting to make sure that it is firmly attached to the stairs. Fix any carpet that is loose or worn.  Avoid having throw rugs at the top or bottom of the stairs. If you do have throw rugs, attach them to the floor with carpet tape.  Make sure that you have a light switch at the top of the stairs and the bottom of the stairs. If you do not have them, ask someone to add them for you. What else can I do to help prevent falls?  Wear shoes  that:  Do not have high heels.  Have rubber bottoms.  Are comfortable and fit you well.  Are closed at the toe. Do not wear sandals.  If you use a stepladder:  Make sure that it is fully opened. Do not climb a closed stepladder.  Make sure that both sides of the stepladder are locked into place.  Ask someone to hold it for you, if possible.  Clearly mark and make sure that you can see:  Any grab bars or handrails.  First and last steps.  Where the edge of each step is.  Use tools that help you move around (mobility aids) if they are needed. These include:  Canes.  Walkers.  Scooters.  Crutches.  Turn on the lights when you go into a dark area. Replace any light bulbs as soon as they burn out.  Set up your furniture so you have a clear path. Avoid moving your furniture around.  If any of your floors are uneven, fix them.  If there are any pets around you, be aware of where they are.  Review your medicines with your doctor. Some medicines can make you feel dizzy. This can increase your chance of falling. Ask your doctor what other things that you can do to help prevent falls. This information is not intended to replace advice given to you by your health care provider. Make sure you discuss any questions you have with your health care provider. Document Released: 11/22/2008 Document Revised: 07/04/2015 Document Reviewed: 03/02/2014 Elsevier Interactive Patient Education  2017 [employer].

## 2019-05-24 NOTE — Progress Notes (Signed)
Subjective:   Marie Martin is a 86 y.o. female who presents for an Initial Medicare Annual Wellness Visit.  Virtual Visit via Telephone Note  I connected with Curt Bears on 05/24/19 at  2:40 PM EDT by telephone and verified that I am speaking with the correct person using two identifiers.  Medicare Annual Wellness visit completed telephonically due to Covid-19 pandemic.   Location: Patient: home Provider: office   I discussed the limitations, risks, security and privacy concerns of performing an evaluation and management service by telephone and the availability of in person appointments. The patient expressed understanding and agreed to proceed.  Some vital signs may be absent or patient reported.   Reather Littler, LPN    Review of Systems      Cardiac Risk Factors include: advanced age (>34men, >26 women)     Objective:    There were no vitals filed for this visit. There is no height or weight on file to calculate BMI.  Advanced Directives 05/24/2019 12/14/2014  Does Patient Have a Medical Advance Directive? No Yes  Type of Advance Directive - Healthcare Power of [place];Living will  Copy of Healthcare Power of Attorney in Chart? - No - copy requested  Would patient like information on creating a medical advance directive? Yes (MAU/Ambulatory/Procedural Areas - Information given) -    Current Medications (verified) Outpatient Encounter Medications as of 05/24/2019  Medication Sig  . albuterol (PROVENTIL) (2.5 MG/3ML) 0.083% nebulizer solution Take 3 mLs (2.5 mg total) by nebulization every 6 (six) hours as needed for wheezing or shortness of breath.  Marland Kitchen albuterol (VENTOLIN HFA) 108 (90 Base) MCG/ACT inhaler Inhale 2 puffs into the lungs every 6 (six) hours as needed for wheezing or shortness of breath.  . Fluticasone-Salmeterol (ADVAIR DISKUS) 250-50 MCG/DOSE AEPB Inhale 1 puff into the lungs 2 (two) times daily.  . sertraline (ZOLOFT) 100 MG tablet Take 1 tablet  (100 mg total) by mouth daily.  Marland Kitchen tiotropium (SPIRIVA HANDIHALER) 18 MCG inhalation capsule Place 1 capsule (18 mcg total) into inhaler and inhale daily.  . [DISCONTINUED] predniSONE (DELTASONE) 10 MG tablet Take 1 tablet (10 mg total) by mouth daily with breakfast.  . [DISCONTINUED] azithromycin (ZITHROMAX) 250 MG tablet 2 today then 1 a day for 4 day  . [DISCONTINUED] nystatin cream (MYCOSTATIN) Apply 1 application topically 2 (two) times daily. (Patient not taking: Reported on 02/14/2019)   Facility-Administered Encounter Medications as of 05/24/2019  Medication  . ipratropium-albuterol (DUONEB) 0.5-2.5 (3) MG/3ML nebulizer solution 3 mL    Allergies (verified) Amoxicillin-pot clavulanate and Penicillins   History: Past Medical History:  Diagnosis Date  . COPD (chronic obstructive pulmonary disease) (HCC)   . Depression    Past Surgical History:  Procedure Laterality Date  . TONSILLECTOMY     History reviewed. No pertinent family history. Social History   Socioeconomic History  . Marital status: Widowed    Spouse name: Not on file  . Number of children: Not on file  . Years of education: Not on file  . Highest education level: Not on file  Occupational History  . Not on file  Tobacco Use  . Smoking status: Former Games developer  . Smokeless tobacco: Never Used  Substance and Sexual Activity  . Alcohol use: No    Alcohol/week: 0.0 standard drinks  . Drug use: No  . Sexual activity: Not on file  Other Topics Concern  . Not on file  Social History Narrative  . Not on file  Social Determinants of Health   Financial Resource Strain: Low Risk   . Difficulty of Paying Living Expenses: Not hard at all  Food Insecurity: No Food Insecurity  . Worried About Programme researcher, broadcasting/film/video in the Last Year: Never true  . Ran Out of Food in the Last Year: Never true  Transportation Needs: No Transportation Needs  . Lack of Transportation (Medical): No  . Lack of Transportation (Non-Medical):  No  Physical Activity: Insufficiently Active  . Days of Exercise per Week: 7 days  . Minutes of Exercise per Session: 10 min  Stress: No Stress Concern Present  . Feeling of Stress : Only a little  Social Connections: Unknown  . Frequency of Communication with Friends and Family: Patient refused  . Frequency of Social Gatherings with Friends and Family: Patient refused  . Attends Religious Services: Patient refused  . Active Member of Clubs or Organizations: Patient refused  . Attends Banker Meetings: Patient refused  . Marital Status: Widowed    Tobacco Counseling Counseling given: Not Answered   Clinical Intake:  Pre-visit preparation completed: Yes  Pain : No/denies pain     Nutritional Risks: None Diabetes: No  How often do you need to have someone help you when you read instructions, pamphlets, or other written materials from your doctor or pharmacy?: 1 - Never  Interpreter Needed?: No  Information entered by :: Reather Littler LPN   Activities of Daily Living In your present state of health, do you have any difficulty performing the following activities: 05/24/2019  Hearing? Y  Comment declines hearing aids  Vision? N  Difficulty concentrating or making decisions? N  Walking or climbing stairs? N  Dressing or bathing? N  Doing errands, shopping? N  Preparing Food and eating ? N  Using the Toilet? N  In the past six months, have you accidently leaked urine? N  Do you have problems with loss of bowel control? N  Managing your Medications? N  Managing your Finances? N  Housekeeping or managing your Housekeeping? N  Some recent data might be hidden     Immunizations and Health Maintenance Immunization History  Administered Date(s) Administered  . Fluad Quad(high Dose 65+) 10/03/2018  . Influenza, High Dose Seasonal PF 10/21/2017  . Influenza,inj,Quad PF,6+ Mos 01/14/2015, 12/02/2015, 10/01/2016  . Influenza-Unspecified 11/14/2013, 01/14/2015,  12/02/2015, 10/01/2016  . PFIZER SARS-COV-2 Vaccination 04/19/2019, 05/17/2019  . Pneumococcal Conjugate-13 10/01/2016  . Pneumococcal Polysaccharide-23 08/02/2012  . Zoster 07/22/2010   There are no preventive care reminders to display for this patient.  Patient Care Team: Duanne Limerick, MD as PCP - General (Family Medicine)  Indicate any recent Medical Services you may have received from other than [facility] providers in the past year (date may be approximate).     Assessment:   This is a routine wellness examination for Bailey.  Hearing/Vision screen  Hearing Screening   125Hz  250Hz  500Hz  1000Hz  2000Hz  3000Hz  4000Hz  6000Hz  8000Hz   Right ear:           Left ear:           Comments: Pt states mild hearing difficulty at times with certain tones   Vision Screening Comments: Annual vision screenings with Dr. in [facility]  Dietary issues and exercise activities discussed: Current Exercise Habits: Home exercise routine, Type of exercise: walking, Time (Minutes): 10, Frequency (Times/Week): 7, Weekly Exercise (Minutes/Week): 70, Intensity: Mild  Goals    . Patient Stated     Pt would like  to be able to travel to [place] in October      Depression Screen PHQ 2/9 Scores 05/24/2019 02/14/2019 08/01/2018 04/28/2018 01/17/2018 08/26/2017 10/01/2016  PHQ - 2 Score 2 2 0 0 0 0 0  PHQ- 9 Score 3 2 0 - 0 0 -    Fall Risk Fall Risk  05/24/2019 02/14/2019 04/28/2018 10/01/2016 06/14/2015  Falls in the past year? 0 0 0 No No  Number falls in past yr: 0 - 0 - -  Injury with Fall? 0 - 0 - -  Risk for fall due to : No Fall Risks - Impaired balance/gait;History of fall(s) - -  Follow up Falls prevention discussed Falls evaluation completed Falls evaluation completed - -    FALL RISK PREVENTION PERTAINING TO THE HOME:  Any stairs in or around the home? Yes  If so, do they handrails? Yes   Home free of loose throw rugs in walkways, pet beds, electrical cords, etc? Yes  Adequate lighting  in your home to reduce risk of falls? Yes   ASSISTIVE DEVICES UTILIZED TO PREVENT FALLS:  Life alert? No  Use of a cane, walker or w/c? No  Grab bars in the bathroom? No  Shower chair or bench in shower? Yes  Elevated toilet seat or a handicapped toilet? No   DME ORDERS:  DME order needed?  No   TIMED UP AND GO:  Was the test performed? No . Telephonic visit.   Education: Fall risk prevention has been discussed.  Intervention(s) required? No   Cognitive Function: pt declines 6CIT for 2021 AWV        Screening Tests Health Maintenance  Topic Date Due  . TETANUS/TDAP  02/10/2020 (Originally 03/07/1950)  . INFLUENZA VACCINE  09/10/2019  . PNA vac Low Risk Adult  Completed  . DEXA SCAN  Discontinued    Qualifies for Shingles Vaccine? Yes  Zostavax completed 2012. Due for Shingrix. Education has been provided regarding the importance of this vaccine. Pt has been advised to call insurance company to determine out of pocket expense. Advised may also receive vaccine at local pharmacy or Health Dept. Verbalized acceptance and understanding.  Tdap: Although this vaccine is not a covered service during a Wellness Exam, does the patient still wish to receive this vaccine today?  No .  Education has been provided regarding the importance of this vaccine. Advised may receive this vaccine at local pharmacy or Health Dept. Aware to provide a copy of the vaccination record if obtained from local pharmacy or Health Dept. Verbalized acceptance and understanding.  Flu Vaccine: Up to date  Pneumococcal Vaccine: Up to date   Cancer Screenings:  Colorectal Screening: No longer required.   Mammogram:  No longer required.   Bone Density: No longer required.   Lung Cancer Screening: (Low Dose CT Chest recommended if Age 47-80 years, 30 pack-year currently smoking OR have quit w/in 15years.) does not qualify.   Additional Screening:  Hepatitis C Screening: no longer required  Vision  Screening: Recommended annual ophthalmology exams for early detection of glaucoma and other disorders of the eye. Is the patient up to date with their annual eye exam?  Yes  Who is the provider or what is the name of the office in which the pt attends annual eye exams? Dr. Ruthann Cancer   Dental Screening: Recommended annual dental exams for proper oral hygiene  Community Resource Referral:  CRR required this visit?  No      Plan:    I  have personally reviewed and addressed the Medicare Annual Wellness questionnaire and have noted the following in the patient's chart:  A. Medical and social history B. Use of alcohol, tobacco or illicit drugs  C. Current medications and supplements D. Functional ability and status E.  Nutritional status F.  Physical activity G. Advance directives H. List of other physicians I.  Hospitalizations, surgeries, and ER visits in previous 12 months J.  Vitals K. Screenings such as hearing and vision if needed, cognitive and depression L. Referrals and appointments   In addition, I have reviewed and discussed with patient certain preventive protocols, quality metrics, and best practice recommendations. A written personalized care plan for preventive services as well as general preventive health recommendations were provided to patient.   Signed,  Reather Littler, LPN Nurse Health Advisor   Nurse Notes: pt doing well and appreciative of visit today

## 2019-06-10 DIAGNOSIS — J449 Chronic obstructive pulmonary disease, unspecified: Secondary | ICD-10-CM | POA: Diagnosis not present

## 2019-07-03 ENCOUNTER — Telehealth: Payer: Self-pay | Admitting: Family Medicine

## 2019-07-03 NOTE — Telephone Encounter (Signed)
Pt wants labs done on day of appt

## 2019-07-03 NOTE — Telephone Encounter (Signed)
Copied from CRM [id number]. Topic: General - Other >> Jul 03, 2019  9:29 AM Angela Nevin wrote: Patient requesting orders for "routine blood work" done at 06/1. Patient also requesting to speak with Delice Bison, patient would not disclose additional information.

## 2019-07-11 ENCOUNTER — Encounter: Payer: Self-pay | Admitting: Family Medicine

## 2019-07-11 ENCOUNTER — Other Ambulatory Visit: Payer: Self-pay

## 2019-07-11 ENCOUNTER — Ambulatory Visit (INDEPENDENT_AMBULATORY_CARE_PROVIDER_SITE_OTHER): Payer: Medicare Other | Admitting: Family Medicine

## 2019-07-11 VITALS — BP 120/70 | HR 60 | Ht 66.0 in | Wt 138.0 lb

## 2019-07-11 DIAGNOSIS — F339 Major depressive disorder, recurrent, unspecified: Secondary | ICD-10-CM

## 2019-07-11 DIAGNOSIS — N1831 Chronic kidney disease, stage 3a: Secondary | ICD-10-CM

## 2019-07-11 DIAGNOSIS — J432 Centrilobular emphysema: Secondary | ICD-10-CM

## 2019-07-11 DIAGNOSIS — E7801 Familial hypercholesterolemia: Secondary | ICD-10-CM

## 2019-07-11 DIAGNOSIS — J449 Chronic obstructive pulmonary disease, unspecified: Secondary | ICD-10-CM | POA: Diagnosis not present

## 2019-07-11 NOTE — Progress Notes (Signed)
Date:  07/11/2019   Name:  Marie Martin   DOB:  13-Jun-1931   MRN:  [id number]   Chief Complaint: COPD (med refill)  COPD She complains of cough, frequent throat clearing and shortness of breath. There is no chest tightness, difficulty breathing, hemoptysis, hoarse voice, sputum production or wheezing. This is a chronic problem. The current episode started more than 1 year ago. The problem occurs intermittently. The problem has been waxing and waning. The cough is non-productive. Associated symptoms include dyspnea on exertion and malaise/fatigue. Pertinent negatives include no chest pain, ear pain, fever, headaches, myalgias, PND, postnasal drip, rhinorrhea, sneezing or sore throat. Her symptoms are alleviated by beta-agonist and ipratropium. She reports moderate improvement on treatment. Her past medical history is significant for COPD.  Hyperlipidemia This is a chronic problem. The current episode started more than 1 year ago. The problem is controlled. Recent lipid tests were reviewed and are normal. Exacerbating diseases include chronic renal disease. She has no history of diabetes, hypothyroidism, liver disease, obesity or nephrotic syndrome. Associated symptoms include shortness of breath. Pertinent negatives include no chest pain or myalgias. Current antihyperlipidemic treatment includes diet change.    Lab Results  Component Value Date   CREATININE 1.24 (H) 02/14/2019   BUN 25 02/14/2019   NA 141 02/14/2019   K 5.4 (H) 02/14/2019   CL 105 02/14/2019   CO2 23 02/14/2019   Lab Results  Component Value Date   CHOL 232 (H) 12/02/2015   HDL 62 12/02/2015   LDLCALC 141 (H) 12/02/2015   TRIG 143 12/02/2015   CHOLHDL 3.7 12/02/2015   No results found for: TSH No results found for: HGBA1C No results found for: WBC, HGB, HCT, MCV, PLT No results found for: ALT, AST, GGT, ALKPHOS, BILITOT   Review of Systems  Constitutional: Positive for malaise/fatigue. Negative for chills,  fatigue, fever and unexpected weight change.  HENT: Negative for congestion, ear discharge, ear pain, hoarse voice, postnasal drip, rhinorrhea, sinus pressure, sneezing and sore throat.   Eyes: Negative for photophobia, pain, discharge, redness and itching.  Respiratory: Positive for cough and shortness of breath. Negative for hemoptysis, sputum production, wheezing and stridor.   Cardiovascular: Positive for dyspnea on exertion. Negative for chest pain and PND.  Gastrointestinal: Negative for abdominal pain, blood in stool, constipation, diarrhea, nausea and vomiting.  Endocrine: Negative for cold intolerance, heat intolerance, polydipsia, polyphagia and polyuria.  Genitourinary: Negative for dysuria, flank pain, frequency, hematuria, menstrual problem, pelvic pain, urgency, vaginal bleeding and vaginal discharge.  Musculoskeletal: Negative for arthralgias, back pain and myalgias.  Skin: Negative for rash.  Allergic/Immunologic: Negative for environmental allergies and food allergies.  Neurological: Negative for dizziness, weakness, light-headedness, numbness and headaches.  Hematological: Negative for adenopathy. Does not bruise/bleed easily.  Psychiatric/Behavioral: Negative for dysphoric mood. The patient is not nervous/anxious.     Patient Active Problem List   Diagnosis Date Noted  . COPD exacerbation (HCC) 02/22/2017  . Cough 02/22/2017  . Depression 02/22/2017  . SOB (shortness of breath) 02/22/2017  . 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 Recurrent major depressive episodes (HCC) 05/31/2014    Allergies  Allergen Reactions  . Amoxicillin-Pot Clavulanate Nausea Only and Other (See Comments)    Other Reaction: trouble swallowing  . Penicillins     Past Surgical History:  Procedure Laterality Date  . TONSILLECTOMY      Social History   Tobacco Use  . Smoking status: Former Games developer  . Smokeless tobacco: Never Used  Substance Use Topics  . Alcohol use: No     Alcohol/week: 0.0 standard drinks  . Drug use: No     Medication list has been reviewed and updated.  Current Meds  Medication Sig  . albuterol (PROVENTIL) (2.5 MG/3ML) 0.083% nebulizer solution Take 3 mLs (2.5 mg total) by nebulization every 6 (six) hours as needed for wheezing or shortness of breath.  Marland Kitchen albuterol (VENTOLIN HFA) 108 (90 Base) MCG/ACT inhaler Inhale 2 puffs into the lungs every 6 (six) hours as needed for wheezing or shortness of breath.  . Fluticasone-Salmeterol (ADVAIR DISKUS) 250-50 MCG/DOSE AEPB Inhale 1 puff into the lungs 2 (two) times daily.  . sertraline (ZOLOFT) 100 MG tablet Take 1 tablet (100 mg total) by mouth daily.  Marland Kitchen tiotropium (SPIRIVA HANDIHALER) 18 MCG inhalation capsule Place 1 capsule (18 mcg total) into inhaler and inhale daily.   Current Facility-Administered Medications for the 07/11/19 encounter (Office Visit) with Juline Patch, MD  Medication  . ipratropium-albuterol (DUONEB) 0.5-2.5 (3) MG/3ML nebulizer solution 3 mL    PHQ 2/9 Scores 07/11/2019 05/24/2019 02/14/2019 08/01/2018  PHQ - 2 Score 0 2 2 0  PHQ- 9 Score 0 3 2 0    BP Readings from Last 3 Encounters:  07/11/19 120/70  04/19/19 110/70  02/14/19 124/80    Physical Exam Vitals and nursing note reviewed.  Constitutional:      Appearance: She is well-developed.  HENT:     Head: Normocephalic.     Right Ear: Tympanic membrane, ear canal and external ear normal.     Left Ear: Tympanic membrane, ear canal and external ear normal.     Nose: Nose normal.     Mouth/Throat:     Mouth: Mucous membranes are moist.  Eyes:     General: Lids are everted, no foreign bodies appreciated. No scleral icterus.       Left eye: No foreign body or hordeolum.     Conjunctiva/sclera: Conjunctivae normal.     Right eye: Right conjunctiva is not injected.     Left eye: Left conjunctiva is not injected.     Pupils: Pupils are equal, round, and reactive to light.  Neck:     Thyroid: No thyromegaly.      Vascular: No JVD.     Trachea: No tracheal deviation.  Cardiovascular:     Rate and Rhythm: Normal rate and regular rhythm.     Heart sounds: Normal heart sounds. No murmur. No friction rub. No gallop.   Pulmonary:     Effort: Pulmonary effort is normal. No respiratory distress.     Breath sounds: Normal breath sounds. No stridor. No wheezing, rhonchi or rales.  Chest:     Chest wall: No tenderness.  Abdominal:     General: Bowel sounds are normal.     Palpations: Abdomen is soft. There is no mass.     Tenderness: There is no abdominal tenderness. There is no guarding or rebound.  Musculoskeletal:        General: No tenderness. Normal range of motion.     Cervical back: Normal range of motion and neck supple.  Lymphadenopathy:     Cervical: No cervical adenopathy.  Skin:    General: Skin is warm.     Findings: No rash.  Neurological:     Mental Status: She is alert and oriented to person, place, and time.     Cranial Nerves: No cranial nerve deficit.     Deep Tendon Reflexes: Reflexes normal.  Psychiatric:        Mood and Affect: Mood is not anxious or depressed.     Wt Readings from Last 3 Encounters:  07/11/19 138 lb (62.6 kg)  04/19/19 139 lb (63 kg)  02/14/19 141 lb (64 kg)    BP 120/70   Pulse 60   Ht 5\' 6"  (1.676 m)   Wt 138 lb (62.6 kg)   SpO2 98%   BMI 22.27 kg/m   Assessment and Plan:  1. Centriacinar emphysema (HCC) Chronic.  Persistent.  Stable.  Continue albuterol inhaler 1 to 2 puffs every 6 hours for episodic control as well as baseline control of Advair discus 2 50-50 and Spiriva inhaler daily.  2. Recurrent major depressive episodes (HCC) Chronic.  Partially controlled on medication.  PHQ 0.  Stable.  Other than the stress of dealing with 5 grandchildren in the same house patient is handling her current depressive state well and is in a good disposition today.  We will continue sertraline 100 mg once a day.  3. Stage 3a chronic kidney  disease Patient has mild decrease of the GFR which is consistent with stage IIIa CKD.  We will check a renal function panel to assess creatinine and GFR currently. - Renal Function Panel  4. Familial hypercholesterolemia Panic.  Controlled.  Stable.  Patient is currently controlling lipid control with diet.  Will check lipid panel for evaluation. - Lipid Panel With LDL/HDL Ratio

## 2019-07-12 LAB — RENAL FUNCTION PANEL
Albumin: 4.2 g/dL (ref 3.6–4.6)
BUN/Creatinine Ratio: 17 (ref 12–28)
BUN: 22 mg/dL (ref 8–27)
CO2: 23 mmol/L (ref 20–29)
Calcium: 8.9 mg/dL (ref 8.7–10.3)
Chloride: 103 mmol/L (ref 96–106)
Creatinine, Ser: 1.27 mg/dL — ABNORMAL HIGH (ref 0.57–1.00)
GFR calc Af Amer: 44 mL/min/{1.73_m2} — ABNORMAL LOW (ref >59)
GFR calc non Af Amer: 38 mL/min/{1.73_m2} — ABNORMAL LOW (ref >59)
Glucose: 89 mg/dL (ref 65–99)
Phosphorus: 3.6 mg/dL (ref 3.0–4.3)
Potassium: 4.8 mmol/L (ref 3.5–5.2)
Sodium: 142 mmol/L (ref 134–144)

## 2019-07-12 LAB — LIPID PANEL WITH LDL/HDL RATIO
Cholesterol, Total: 255 mg/dL — ABNORMAL HIGH (ref 100–199)
HDL: 61 mg/dL (ref >39)
LDL Chol Calc (NIH): 169 mg/dL — ABNORMAL HIGH (ref 0–99)
LDL/HDL Ratio: 2.8 ratio (ref 0.0–3.2)
Triglycerides: 141 mg/dL (ref 0–149)
VLDL Cholesterol Cal: 25 mg/dL (ref 5–40)

## 2019-07-27 ENCOUNTER — Ambulatory Visit (INDEPENDENT_AMBULATORY_CARE_PROVIDER_SITE_OTHER): Payer: Medicare Other | Admitting: Family Medicine

## 2019-07-27 ENCOUNTER — Other Ambulatory Visit: Payer: Self-pay

## 2019-07-27 ENCOUNTER — Ambulatory Visit: Payer: Medicare Other | Admitting: Family Medicine

## 2019-07-27 ENCOUNTER — Encounter: Payer: Self-pay | Admitting: Family Medicine

## 2019-07-27 VITALS — BP 120/70 | HR 64 | Ht 66.0 in | Wt 138.0 lb

## 2019-07-27 DIAGNOSIS — J440 Chronic obstructive pulmonary disease with acute lower respiratory infection: Secondary | ICD-10-CM

## 2019-07-27 DIAGNOSIS — J209 Acute bronchitis, unspecified: Secondary | ICD-10-CM

## 2019-07-27 MED ORDER — AZITHROMYCIN 250 MG PO TABS: ORAL_TABLET | ORAL | 0 refills | Status: DC

## 2019-07-27 MED ORDER — PREDNISONE 10 MG PO TABS: 10.0000 mg | ORAL_TABLET | Freq: Every day | ORAL | 0 refills | Status: DC

## 2019-07-27 NOTE — Progress Notes (Signed)
Date:  07/27/2019   Name:  Marie Martin   DOB:  1931-07-15   MRN:  [id number]   Chief Complaint: Sinusitis (yellow production)  Cough This is a recurrent problem. The current episode started in the past 7 days. The problem has been gradually worsening. The cough is productive of purulent sputum. Associated symptoms include shortness of breath and wheezing. Pertinent negatives include no chest pain, chills, ear congestion, ear pain, eye redness, fever, headaches, heartburn, hemoptysis, myalgias, nasal congestion, postnasal drip, rash, rhinorrhea, sore throat, sweats or weight loss. She has tried ipratropium inhaler, a beta-agonist inhaler and steroid inhaler for the symptoms. The treatment provided mild relief. Her past medical history is significant for COPD and emphysema. There is no history of environmental allergies.    Lab Results  Component Value Date   CREATININE 1.27 (H) 07/11/2019   BUN 22 07/11/2019   NA 142 07/11/2019   K 4.8 07/11/2019   CL 103 07/11/2019   CO2 23 07/11/2019   Lab Results  Component Value Date   CHOL 255 (H) 07/11/2019   HDL 61 07/11/2019   LDLCALC 169 (H) 07/11/2019   TRIG 141 07/11/2019   CHOLHDL 3.7 12/02/2015   No results found for: TSH No results found for: HGBA1C No results found for: WBC, HGB, HCT, MCV, PLT No results found for: ALT, AST, GGT, ALKPHOS, BILITOT   Review of Systems  Constitutional: Negative.  Negative for chills, fatigue, fever, unexpected weight change and weight loss.  HENT: Negative for congestion, ear discharge, ear pain, postnasal drip, rhinorrhea, sinus pressure, sneezing and sore throat.   Eyes: Negative for photophobia, pain, discharge, redness and itching.  Respiratory: Positive for cough, shortness of breath and wheezing. Negative for hemoptysis and stridor.   Cardiovascular: Negative for chest pain, palpitations and leg swelling.  Gastrointestinal: Negative for abdominal pain, blood in stool, constipation,  diarrhea, heartburn, nausea and vomiting.  Endocrine: Negative for cold intolerance, heat intolerance, polydipsia, polyphagia and polyuria.  Genitourinary: Negative for dysuria, flank pain, frequency, hematuria, menstrual problem, pelvic pain, urgency, vaginal bleeding and vaginal discharge.  Musculoskeletal: Negative for arthralgias, back pain and myalgias.  Skin: Negative for rash.  Allergic/Immunologic: Negative for environmental allergies and food allergies.  Neurological: Negative for dizziness, weakness, light-headedness, numbness and headaches.  Hematological: Negative for adenopathy. Does not bruise/bleed easily.  Psychiatric/Behavioral: Negative for dysphoric mood. The patient is not nervous/anxious.     Patient Active Problem List   Diagnosis Date Noted  . COPD exacerbation ([place]) 02/22/2017  . Cough 02/22/2017  . Depression 02/22/2017  . SOB (shortness of breath) 02/22/2017  . Centriacinar emphysema ([place]) 05/31/2014  . Recurrent major depressive episodes ([place]) 05/31/2014    Allergies  Allergen Reactions  . Amoxicillin-Pot Clavulanate Nausea Only and Other (See Comments)    Other Reaction: trouble swallowing  . Penicillins     Past Surgical History:  Procedure Laterality Date  . TONSILLECTOMY      Social History   Tobacco Use  . Smoking status: Former Research scientist (life sciences)  . Smokeless tobacco: Never Used  Vaping Use  . Vaping Use: Never used  Substance Use Topics  . Alcohol use: No    Alcohol/week: 0.0 standard drinks  . Drug use: No     Medication list has been reviewed and updated.  Current Meds  Medication Sig  . albuterol (PROVENTIL) (2.5 MG/3ML) 0.083% nebulizer solution Take 3 mLs (2.5 mg total) by nebulization every 6 (six) hours as needed for wheezing or shortness of breath.  Marland Kitchen  albuterol (VENTOLIN HFA) 108 (90 Base) MCG/ACT inhaler Inhale 2 puffs into the lungs every 6 (six) hours as needed for wheezing or shortness of breath.  . Fluticasone-Salmeterol (ADVAIR  DISKUS) 250-50 MCG/DOSE AEPB Inhale 1 puff into the lungs 2 (two) times daily.  . sertraline (ZOLOFT) 100 MG tablet Take 1 tablet (100 mg total) by mouth daily.  Marland Kitchen tiotropium (SPIRIVA HANDIHALER) 18 MCG inhalation capsule Place 1 capsule (18 mcg total) into inhaler and inhale daily.   Current Facility-Administered Medications for the 07/27/19 encounter (Office Visit) with Duanne Limerick, MD  Medication  . ipratropium-albuterol (DUONEB) 0.5-2.5 (3) MG/3ML nebulizer solution 3 mL    PHQ 2/9 Scores 07/27/2019 07/11/2019 05/24/2019 02/14/2019  PHQ - 2 Score 0 0 2 2  PHQ- 9 Score 0 0 3 2    GAD 7 : Generalized Anxiety Score 07/27/2019 07/11/2019 02/14/2019  Nervous, Anxious, on Edge 0 0 1  Control/stop worrying 0 0 0  Worry too much - different things 0 0 0  Trouble relaxing 0 0 0  Restless 0 0 0  Easily annoyed or irritable 0 0 0  Afraid - awful might happen 0 0 0  Total GAD 7 Score 0 0 1  Anxiety Difficulty - - Not difficult at all    BP Readings from Last 3 Encounters:  07/27/19 120/70  07/11/19 120/70  04/19/19 110/70    Physical Exam Vitals and nursing note reviewed.  Constitutional:      General: She is not in acute distress.    Appearance: She is not diaphoretic.  HENT:     Head: Normocephalic and atraumatic.     Jaw: There is normal jaw occlusion.     Right Ear: Tympanic membrane, ear canal and external ear normal.     Left Ear: Tympanic membrane, ear canal and external ear normal.     Nose: Nose normal.     Mouth/Throat:     Lips: Pink.     Pharynx: Oropharynx is clear. Uvula midline. No posterior oropharyngeal erythema.  Eyes:     General:        Right eye: No discharge.        Left eye: No discharge.     Conjunctiva/sclera: Conjunctivae normal.     Pupils: Pupils are equal, round, and reactive to light.  Neck:     Thyroid: No thyromegaly.     Vascular: No JVD.  Cardiovascular:     Rate and Rhythm: Normal rate and regular rhythm.     Heart sounds: Normal heart  sounds. No murmur heard.  No friction rub. No gallop.   Pulmonary:     Effort: Pulmonary effort is normal.     Breath sounds: Normal breath sounds. No decreased air movement. No decreased breath sounds, wheezing, rhonchi or rales.  Abdominal:     General: Bowel sounds are normal.     Palpations: Abdomen is soft. There is no mass.     Tenderness: There is no abdominal tenderness. There is no guarding.  Musculoskeletal:        General: Normal range of motion.     Cervical back: Normal range of motion and neck supple.  Lymphadenopathy:     Cervical: No cervical adenopathy.  Skin:    General: Skin is warm and dry.  Neurological:     Mental Status: She is alert.     Deep Tendon Reflexes: Reflexes are normal and symmetric.     Wt Readings from Last 3 Encounters:  07/27/19 138  lb (62.6 kg)  07/11/19 138 lb (62.6 kg)  04/19/19 139 lb (63 kg)    BP 120/70   Pulse 64   Ht 5\' 6"  (1.676 m)   Wt 138 lb (62.6 kg)   SpO2 94%   BMI 22.27 kg/m   Assessment and Plan: 1. COPD (chronic obstructive pulmonary disease) with acute bronchitis (HCC) COPD is chronic and currently under fair control with Advair and rescue albuterol.  As per previous occasions Patient develops a productive cough with yellow to green sputum patient has responded to a azithromycin to 50 mg 2 today followed by 1 a day for 4 days and prednisone 10 mg once a day. - azithromycin (ZITHROMAX) 250 MG tablet; 2 today then 1 a day for 4 days  Dispense: 6 tablet; Refill: 0 - predniSONE (DELTASONE) 10 MG tablet; Take 1 tablet (10 mg total) by mouth daily with breakfast.  Dispense: 30 tablet; Refill: 0

## 2019-08-09 ENCOUNTER — Other Ambulatory Visit: Payer: Self-pay | Admitting: Family Medicine

## 2019-08-09 DIAGNOSIS — F339 Major depressive disorder, recurrent, unspecified: Secondary | ICD-10-CM

## 2019-08-10 DIAGNOSIS — J449 Chronic obstructive pulmonary disease, unspecified: Secondary | ICD-10-CM | POA: Diagnosis not present

## 2019-09-08 ENCOUNTER — Telehealth: Payer: Self-pay | Admitting: Family Medicine

## 2019-09-08 NOTE — Telephone Encounter (Signed)
Told pt to take 10mg  of pred for Sat and Sun until she can be seen on Monday. She has this at home

## 2019-09-08 NOTE — Telephone Encounter (Signed)
Copied from CRM [id number]. Topic: General - Other >> Sep 08, 2019  2:03 PM Tamela Oddi wrote: Reason for CRM: Patient would like to speak to Marie Martin regarding whether she is able to take Prednisone.  She stated she had a few questions and would like a call back today if possible.  CB# [phone]

## 2019-09-10 DIAGNOSIS — J449 Chronic obstructive pulmonary disease, unspecified: Secondary | ICD-10-CM | POA: Diagnosis not present

## 2019-09-11 ENCOUNTER — Other Ambulatory Visit: Payer: Self-pay

## 2019-09-11 ENCOUNTER — Ambulatory Visit (INDEPENDENT_AMBULATORY_CARE_PROVIDER_SITE_OTHER): Payer: Medicare Other | Admitting: Family Medicine

## 2019-09-11 ENCOUNTER — Encounter: Payer: Self-pay | Admitting: Family Medicine

## 2019-09-11 VITALS — BP 124/68 | HR 80 | Temp 98.7°F | Ht 66.0 in | Wt 140.0 lb

## 2019-09-11 DIAGNOSIS — R05 Cough: Secondary | ICD-10-CM

## 2019-09-11 DIAGNOSIS — J440 Chronic obstructive pulmonary disease with acute lower respiratory infection: Secondary | ICD-10-CM | POA: Diagnosis not present

## 2019-09-11 DIAGNOSIS — J441 Chronic obstructive pulmonary disease with (acute) exacerbation: Secondary | ICD-10-CM | POA: Diagnosis not present

## 2019-09-11 DIAGNOSIS — J209 Acute bronchitis, unspecified: Secondary | ICD-10-CM | POA: Diagnosis not present

## 2019-09-11 DIAGNOSIS — R059 Cough, unspecified: Secondary | ICD-10-CM

## 2019-09-11 MED ORDER — LEVOFLOXACIN 500 MG PO TABS: 500.0000 mg | ORAL_TABLET | Freq: Every day | ORAL | 0 refills | Status: DC

## 2019-09-11 MED ORDER — GUAIFENESIN-CODEINE 100-10 MG/5ML PO SYRP: 5.0000 mL | ORAL_SOLUTION | Freq: Three times a day (TID) | ORAL | 0 refills | Status: DC | PRN

## 2019-09-11 MED ORDER — ALBUTEROL SULFATE HFA 108 (90 BASE) MCG/ACT IN AERS: 2.0000 | INHALATION_SPRAY | Freq: Four times a day (QID) | RESPIRATORY_TRACT | 1 refills | Status: DC | PRN

## 2019-09-11 MED ORDER — PREDNISONE 10 MG PO TABS: 10.0000 mg | ORAL_TABLET | Freq: Every day | ORAL | 0 refills | Status: DC

## 2019-09-11 NOTE — Addendum Note (Signed)
Addended by: Duanne Limerick on: 09/11/2019 01:28 PM   Modules accepted: Orders

## 2019-09-11 NOTE — Progress Notes (Addendum)
Date:  09/11/2019   Name:  Marie Martin   DOB:  [birth year]   MRN:  [id number]   Chief Complaint: COPD (x4-5 days, runny nose clear mucous, coughing up yellow mucous  )  COPD She complains of cough, difficulty breathing, shortness of breath, sputum production and wheezing. There is no chest tightness, frequent throat clearing, hemoptysis or hoarse voice. This is a chronic problem. The current episode started more than 1 year ago. The problem occurs intermittently. The cough is productive of purulent sputum. Associated symptoms include dyspnea on exertion. Pertinent negatives include no appetite change, chest pain, ear congestion, ear pain, fever, headaches, heartburn, malaise/fatigue, myalgias, nasal congestion, orthopnea, PND, postnasal drip, rhinorrhea, sneezing, sore throat, sweats, trouble swallowing or weight loss. Her symptoms are aggravated by pollen and URI. Her symptoms are alleviated by beta-agonist, ipratropium, steroid inhaler and oral steroids. She reports moderate improvement on treatment. Her past medical history is significant for COPD.    Lab Results  Component Value Date   CREATININE 1.27 (H) 07/11/2019   BUN 22 07/11/2019   NA 142 07/11/2019   K 4.8 07/11/2019   CL 103 07/11/2019   CO2 23 07/11/2019   Lab Results  Component Value Date   CHOL 255 (H) 07/11/2019   HDL 61 07/11/2019   LDLCALC 169 (H) 07/11/2019   TRIG 141 07/11/2019   CHOLHDL 3.7 12/02/2015   No results found for: TSH No results found for: HGBA1C No results found for: WBC, HGB, HCT, MCV, PLT No results found for: ALT, AST, GGT, ALKPHOS, BILITOT   Review of Systems  Constitutional: Negative.  Negative for appetite change, chills, fatigue, fever, malaise/fatigue, unexpected weight change and weight loss.  HENT: Negative for congestion, ear discharge, ear pain, hoarse voice, postnasal drip, rhinorrhea, sinus pressure, sneezing, sore throat and trouble swallowing.   Eyes: Negative for photophobia,  pain, discharge, redness and itching.  Respiratory: Positive for cough, sputum production, shortness of breath and wheezing. Negative for hemoptysis and stridor.   Cardiovascular: Positive for dyspnea on exertion. Negative for chest pain and PND.  Gastrointestinal: Negative for abdominal pain, blood in stool, constipation, diarrhea, heartburn, nausea and vomiting.  Endocrine: Negative for cold intolerance, heat intolerance, polydipsia, polyphagia and polyuria.  Genitourinary: Negative for dysuria, flank pain, frequency, hematuria, menstrual problem, pelvic pain, urgency, vaginal bleeding and vaginal discharge.  Musculoskeletal: Negative for arthralgias, back pain and myalgias.  Skin: Negative for rash.  Allergic/Immunologic: Negative for environmental allergies and food allergies.  Neurological: Negative for dizziness, weakness, light-headedness, numbness and headaches.  Hematological: Negative for adenopathy. Does not bruise/bleed easily.  Psychiatric/Behavioral: Negative for dysphoric mood. The patient is not nervous/anxious.     Patient Active Problem List   Diagnosis Date Noted  . COPD exacerbation (HCC) 02/22/2017  . Cough 02/22/2017  . Depression 02/22/2017  . SOB (shortness of breath) 02/22/2017  . 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 Recurrent major depressive episodes (HCC) 05/31/2014    Allergies  Allergen Reactions  . Amoxicillin-Pot Clavulanate Nausea Only and Other (See Comments)    Other Reaction: trouble swallowing  . Penicillins     Past Surgical History:  Procedure Laterality Date  . TONSILLECTOMY      Social History   Tobacco Use  . Smoking status: Former Games developer  . Smokeless tobacco: Never Used  Vaping Use  . Vaping Use: Never used  Substance Use Topics  . Alcohol use: No    Alcohol/week: 0.0 standard drinks  . Drug use: No  Medication list has been reviewed and updated.  Current Meds  Medication Sig  . albuterol (PROVENTIL) (2.5  MG/3ML) 0.083% nebulizer solution Take 3 mLs (2.5 mg total) by nebulization every 6 (six) hours as needed for wheezing or shortness of breath.  Marland Kitchen albuterol (VENTOLIN HFA) 108 (90 Base) MCG/ACT inhaler Inhale 2 puffs into the lungs every 6 (six) hours as needed for wheezing or shortness of breath.  . Fluticasone-Salmeterol (ADVAIR DISKUS) 250-50 MCG/DOSE AEPB Inhale 1 puff into the lungs 2 (two) times daily.  . sertraline (ZOLOFT) 100 MG tablet TAKE 1 TABLET BY MOUTH DAILY GENERIC EQUIVALENT FOR ZOLOFT  . tiotropium (SPIRIVA HANDIHALER) 18 MCG inhalation capsule Place 1 capsule (18 mcg total) into inhaler and inhale daily.   Current Facility-Administered Medications for the 09/11/19 encounter (Office Visit) with Duanne Limerick, MD  Medication  . ipratropium-albuterol (DUONEB) 0.5-2.5 (3) MG/3ML nebulizer solution 3 mL    PHQ 2/9 Scores 09/11/2019 07/27/2019 07/11/2019 05/24/2019  PHQ - 2 Score 0 0 0 2  PHQ- 9 Score 0 0 0 3    GAD 7 : Generalized Anxiety Score 09/11/2019 07/27/2019 07/11/2019 02/14/2019  Nervous, Anxious, on Edge 0 0 0 1  Control/stop worrying 0 0 0 0  Worry too much - different things 0 0 0 0  Trouble relaxing 0 0 0 0  Restless 0 0 0 0  Easily annoyed or irritable 0 0 0 0  Afraid - awful might happen 0 0 0 0  Total GAD 7 Score 0 0 0 1  Anxiety Difficulty Not difficult at all - - Not difficult at all    BP Readings from Last 3 Encounters:  09/11/19 124/68  07/27/19 120/70  07/11/19 120/70    Physical Exam Vitals and nursing note reviewed.  Constitutional:      Appearance: She is well-developed.  HENT:     Head: Normocephalic.     Right Ear: Tympanic membrane, ear canal and external ear normal.     Left Ear: Tympanic membrane, ear canal and external ear normal.  Eyes:     General: Lids are everted, no foreign bodies appreciated. No scleral icterus.       Left eye: No foreign body or hordeolum.     Conjunctiva/sclera: Conjunctivae normal.     Right eye: Right conjunctiva  is not injected.     Left eye: Left conjunctiva is not injected.     Pupils: Pupils are equal, round, and reactive to light.  Neck:     Thyroid: No thyromegaly.     Vascular: No JVD.     Trachea: No tracheal deviation.  Cardiovascular:     Rate and Rhythm: Normal rate and regular rhythm.     Heart sounds: Normal heart sounds. No murmur heard.  No friction rub. No gallop.   Pulmonary:     Effort: Pulmonary effort is normal. No respiratory distress.     Breath sounds: Normal breath sounds. No wheezing or rales.  Abdominal:     General: Bowel sounds are normal.     Palpations: Abdomen is soft. There is no mass.     Tenderness: There is no abdominal tenderness. There is no guarding or rebound.  Musculoskeletal:        General: No tenderness. Normal range of motion.     Cervical back: Normal range of motion and neck supple.  Lymphadenopathy:     Cervical: No cervical adenopathy.  Skin:    General: Skin is warm.     Findings:  No rash.  Neurological:     Mental Status: She is alert and oriented to person, place, and time.     Cranial Nerves: No cranial nerve deficit.     Deep Tendon Reflexes: Reflexes normal.  Psychiatric:        Mood and Affect: Mood is not anxious or depressed.     Wt Readings from Last 3 Encounters:  09/11/19 140 lb (63.5 kg)  07/27/19 138 lb (62.6 kg)  07/11/19 138 lb (62.6 kg)    BP 124/68   Pulse 80   Temp 98.7 F (37.1 C) (Oral)   Ht 5\' 6"  (1.676 m)   Wt 140 lb (63.5 kg)   SpO2 90%   BMI 22.60 kg/m   Assessment and Plan:  1. COPD (chronic obstructive pulmonary disease) with acute bronchitis (HCC) Chronic.  Episodic.  Uncontrolled.  Patient presently only taking Advair with Spiriva.  Patient has not been taking short active albuterol and on Robitussin-AC 1 teaspoon every 6 hours for 7 days. - guaiFENesin-codeine (ROBITUSSIN AC) 100-10 MG/5ML syrup; Take 5 mLs by mouth 3 (three) times daily as needed.  Dispense: 100 mL; Refill: 0  3. Chronic  obstructive pulmonary disease with acute exacerbation (HCC) Chronic.  Controlled.  Currently exacerbated however patient has not been using short acting albuterol.  Will refill albuterol inhaler and patient understands the importance of taking it with the long-acting albuterol and anticholinergic therapy for total control. - albuterol (VENTOLIN HFA) 108 (90 Base) MCG/ACT inhaler; Inhale 2 puffs into the lungs every 6 (six) hours as needed for wheezing or shortness of breath.  Dispense: 18 g; Refill: 1

## 2019-10-11 DIAGNOSIS — J449 Chronic obstructive pulmonary disease, unspecified: Secondary | ICD-10-CM | POA: Diagnosis not present

## 2019-10-16 ENCOUNTER — Other Ambulatory Visit: Payer: Self-pay | Admitting: Family Medicine

## 2019-10-16 DIAGNOSIS — J432 Centrilobular emphysema: Secondary | ICD-10-CM

## 2019-11-08 ENCOUNTER — Telehealth: Payer: Self-pay

## 2019-11-08 NOTE — Telephone Encounter (Signed)
Pt coming in Friday

## 2019-11-09 ENCOUNTER — Ambulatory Visit (INDEPENDENT_AMBULATORY_CARE_PROVIDER_SITE_OTHER): Payer: Medicare Other | Admitting: Family Medicine

## 2019-11-09 ENCOUNTER — Other Ambulatory Visit: Payer: Self-pay

## 2019-11-09 ENCOUNTER — Encounter: Payer: Self-pay | Admitting: Family Medicine

## 2019-11-09 VITALS — BP 120/80 | HR 56 | Temp 98.1°F | Ht 66.0 in | Wt 140.0 lb

## 2019-11-09 DIAGNOSIS — J432 Centrilobular emphysema: Secondary | ICD-10-CM | POA: Diagnosis not present

## 2019-11-09 DIAGNOSIS — J209 Acute bronchitis, unspecified: Secondary | ICD-10-CM

## 2019-11-09 DIAGNOSIS — J441 Chronic obstructive pulmonary disease with (acute) exacerbation: Secondary | ICD-10-CM

## 2019-11-09 DIAGNOSIS — R05 Cough: Secondary | ICD-10-CM | POA: Diagnosis not present

## 2019-11-09 DIAGNOSIS — R059 Cough, unspecified: Secondary | ICD-10-CM

## 2019-11-09 DIAGNOSIS — J440 Chronic obstructive pulmonary disease with acute lower respiratory infection: Secondary | ICD-10-CM

## 2019-11-09 MED ORDER — LEVOFLOXACIN 500 MG PO TABS: 500.0000 mg | ORAL_TABLET | Freq: Every day | ORAL | 0 refills | Status: DC

## 2019-11-09 MED ORDER — GUAIFENESIN-CODEINE 100-10 MG/5ML PO SYRP: 5.0000 mL | ORAL_SOLUTION | Freq: Three times a day (TID) | ORAL | 0 refills | Status: DC | PRN

## 2019-11-09 NOTE — Progress Notes (Signed)
Date:  11/09/2019   Name:  Marie Martin   DOB:  Sep 12, 1931   MRN:  [id number]   Chief Complaint: Cough (yellow production)  Cough This is a new problem. The current episode started in the past 7 days (worsening). The problem has been gradually worsening. The cough is productive of purulent sputum. Associated symptoms include shortness of breath and wheezing. Pertinent negatives include no chest pain, chills, ear congestion, ear pain, fever, headaches, heartburn, hemoptysis, myalgias, nasal congestion, postnasal drip, rash, rhinorrhea, sore throat, sweats or weight loss. The symptoms are aggravated by pollens. She has tried a beta-agonist inhaler, steroid inhaler and ipratropium inhaler for the symptoms. The treatment provided moderate relief. There is no history of asthma, bronchiectasis, bronchitis, COPD, emphysema, environmental allergies or pneumonia.    Lab Results  Component Value Date   CREATININE 1.27 (H) 07/11/2019   BUN 22 07/11/2019   NA 142 07/11/2019   K 4.8 07/11/2019   CL 103 07/11/2019   CO2 23 07/11/2019   Lab Results  Component Value Date   CHOL 255 (H) 07/11/2019   HDL 61 07/11/2019   LDLCALC 169 (H) 07/11/2019   TRIG 141 07/11/2019   CHOLHDL 3.7 12/02/2015   No results found for: TSH No results found for: HGBA1C No results found for: WBC, HGB, HCT, MCV, PLT No results found for: ALT, AST, GGT, ALKPHOS, BILITOT   Review of Systems  Constitutional: Negative for chills, fever and weight loss.  HENT: Negative for ear pain, postnasal drip, rhinorrhea and sore throat.   Respiratory: Positive for cough, shortness of breath and wheezing. Negative for hemoptysis.   Cardiovascular: Negative for chest pain.  Gastrointestinal: Negative for heartburn.  Musculoskeletal: Negative for myalgias.  Skin: Negative for rash.  Allergic/Immunologic: Negative for environmental allergies.  Neurological: Negative for headaches.    Patient Active Problem List   Diagnosis  Date Noted  . COPD exacerbation (HCC) 02/22/2017  . Cough 02/22/2017  . Depression 02/22/2017  . SOB (shortness of breath) 02/22/2017  . 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 Recurrent major depressive episodes (HCC) 05/31/2014    Allergies  Allergen Reactions  . Amoxicillin-Pot Clavulanate Nausea Only and Other (See Comments)    Other Reaction: trouble swallowing  . Penicillins     Past Surgical History:  Procedure Laterality Date  . TONSILLECTOMY      Social History   Tobacco Use  . Smoking status: Former Games developer  . Smokeless tobacco: Never Used  Vaping Use  . Vaping Use: Never used  Substance Use Topics  . Alcohol use: No    Alcohol/week: 0.0 standard drinks  . Drug use: No     Medication list has been reviewed and updated.  Current Meds  Medication Sig  . albuterol (PROVENTIL) (2.5 MG/3ML) 0.083% nebulizer solution Take 3 mLs (2.5 mg total) by nebulization every 6 (six) hours as needed for wheezing or shortness of breath.  Marland Kitchen albuterol (VENTOLIN HFA) 108 (90 Base) MCG/ACT inhaler Inhale 2 puffs into the lungs every 6 (six) hours as needed for wheezing or shortness of breath.  . Fluticasone-Salmeterol (ADVAIR DISKUS) 250-50 MCG/DOSE AEPB Inhale 1 puff into the lungs 2 (two) times daily.  . sertraline (ZOLOFT) 100 MG tablet TAKE 1 TABLET BY MOUTH DAILY GENERIC EQUIVALENT FOR ZOLOFT  . SPIRIVA HANDIHALER 18 MCG inhalation capsule INHALE CONTENTS OF ONE CAPSULE BY MOUTH VIA HANDIHALER DAILY  . [DISCONTINUED] guaiFENesin-codeine (ROBITUSSIN AC) 100-10 MG/5ML syrup Take 5 mLs by mouth 3 (three) times daily as  needed.   Current Facility-Administered Medications for the 11/09/19 encounter (Office Visit) with Duanne Limerick, MD  Medication  . ipratropium-albuterol (DUONEB) 0.5-2.5 (3) MG/3ML nebulizer solution 3 mL    PHQ 2/9 Scores 09/11/2019 07/27/2019 07/11/2019 05/24/2019  PHQ - 2 Score 0 0 0 2  PHQ- 9 Score 0 0 0 3    GAD 7 : Generalized Anxiety Score 09/11/2019  07/27/2019 07/11/2019 02/14/2019  Nervous, Anxious, on Edge 0 0 0 1  Control/stop worrying 0 0 0 0  Worry too much - different things 0 0 0 0  Trouble relaxing 0 0 0 0  Restless 0 0 0 0  Easily annoyed or irritable 0 0 0 0  Afraid - awful might happen 0 0 0 0  Total GAD 7 Score 0 0 0 1  Anxiety Difficulty Not difficult at all - - Not difficult at all    BP Readings from Last 3 Encounters:  11/09/19 120/80  09/11/19 124/68  07/27/19 120/70    Physical Exam  Wt Readings from Last 3 Encounters:  11/09/19 140 lb (63.5 kg)  09/11/19 140 lb (63.5 kg)  07/27/19 138 lb (62.6 kg)    BP 120/80   Pulse (!) 56   Temp 98.1 F (36.7 C)   Ht 5\' 6"  (1.676 m)   Wt 140 lb (63.5 kg)   SpO2 96%   BMI 22.60 kg/m   Assessment and Plan: 1. COPD (chronic obstructive pulmonary disease) with acute bronchitis (HCC) Chronic.  Controlled.  Stable.  Patient with acute exacerbation of bronchitis with underlying COPD/emphysema.  Has been treated with Levaquin in the past 500 mg once a day for a week and has done well and we will do so today. - levofloxacin (LEVAQUIN) 500 MG tablet; Take 1 tablet (500 mg total) by mouth daily.  Dispense: 10 tablet; Refill: 0  2. Centriacinar emphysema (HCC) As noted above.  Patient is to continue on current inhalers of albuterol/Advair/Spiriva.  Patient is following up with Dr. next week.  3. Cough Acute.  Persistent.  Patient with bronchitis associated with COPD and is unable to sleep at night and we will refill her Robitussin-AC. - guaiFENesin-codeine (ROBITUSSIN AC) 100-10 MG/5ML syrup; Take 5 mLs by mouth 3 (three) times daily as needed.  Dispense: 100 mL; Refill: 0  4. Chronic obstructive pulmonary disease with acute exacerbation (HCC) To be followed by Dr. Meredeth Ide.

## 2019-11-09 NOTE — Telephone Encounter (Signed)
Pt is calling and would like tara to know she will see her today

## 2019-11-10 ENCOUNTER — Ambulatory Visit: Payer: Medicare Other | Admitting: Family Medicine

## 2019-11-10 ENCOUNTER — Telehealth: Payer: Self-pay

## 2019-11-10 DIAGNOSIS — J449 Chronic obstructive pulmonary disease, unspecified: Secondary | ICD-10-CM | POA: Diagnosis not present

## 2019-11-10 NOTE — Telephone Encounter (Signed)
Scheduled covid vax #3 Sat. 11/18/2019 @ 2:45 pm [address] [place] conf # [id number]

## 2019-11-14 DIAGNOSIS — J449 Chronic obstructive pulmonary disease, unspecified: Secondary | ICD-10-CM | POA: Diagnosis not present

## 2019-11-14 DIAGNOSIS — Z9981 Dependence on supplemental oxygen: Secondary | ICD-10-CM | POA: Diagnosis not present

## 2019-11-14 DIAGNOSIS — R0600 Dyspnea, unspecified: Secondary | ICD-10-CM | POA: Diagnosis not present

## 2019-11-14 DIAGNOSIS — J90 Pleural effusion, not elsewhere classified: Secondary | ICD-10-CM | POA: Diagnosis not present

## 2019-11-14 DIAGNOSIS — R0602 Shortness of breath: Secondary | ICD-10-CM | POA: Diagnosis not present

## 2019-11-14 DIAGNOSIS — I517 Cardiomegaly: Secondary | ICD-10-CM | POA: Diagnosis not present

## 2019-11-15 ENCOUNTER — Telehealth: Payer: Self-pay

## 2019-11-15 NOTE — Telephone Encounter (Signed)
Copied from CRM [id number]. Topic: General - Other >> Nov 15, 2019  9:43 AM Dalphine Handing A wrote: Pateint would like a callback from [facility] in regards to her recent visit to the pulmonary doctor. Please advise

## 2019-11-16 ENCOUNTER — Telehealth: Payer: Self-pay

## 2019-11-16 NOTE — Telephone Encounter (Unsigned)
Copied from CRM [id number]. Topic: General - Other >> Nov 15, 2019  9:43 AM Dalphine Handing A wrote: Pateint would like a callback from [facility] in regards to her recent visit to the pulmonary doctor. Please advise >> Nov 16, 2019  9:19 AM Tamela Oddi wrote: Patient is calling again to request a call from [facility] regarding her visit with the pulmonary doctor.

## 2019-11-16 NOTE — Telephone Encounter (Signed)
Spoke to pt

## 2019-11-20 ENCOUNTER — Other Ambulatory Visit: Payer: Self-pay | Admitting: Family Medicine

## 2019-12-05 ENCOUNTER — Other Ambulatory Visit: Payer: Self-pay

## 2019-12-05 DIAGNOSIS — J441 Chronic obstructive pulmonary disease with (acute) exacerbation: Secondary | ICD-10-CM

## 2019-12-05 MED ORDER — ALBUTEROL SULFATE (2.5 MG/3ML) 0.083% IN NEBU: 2.5000 mg | INHALATION_SOLUTION | Freq: Four times a day (QID) | RESPIRATORY_TRACT | 1 refills | Status: DC | PRN

## 2019-12-06 ENCOUNTER — Encounter: Payer: Self-pay | Admitting: Family Medicine

## 2019-12-06 ENCOUNTER — Other Ambulatory Visit: Payer: Self-pay

## 2019-12-06 ENCOUNTER — Ambulatory Visit (INDEPENDENT_AMBULATORY_CARE_PROVIDER_SITE_OTHER): Payer: Medicare Other | Admitting: Family Medicine

## 2019-12-06 VITALS — BP 120/80 | HR 64 | Ht 66.0 in | Wt 136.0 lb

## 2019-12-06 DIAGNOSIS — R059 Cough, unspecified: Secondary | ICD-10-CM | POA: Diagnosis not present

## 2019-12-06 DIAGNOSIS — J440 Chronic obstructive pulmonary disease with acute lower respiratory infection: Secondary | ICD-10-CM

## 2019-12-06 DIAGNOSIS — J209 Acute bronchitis, unspecified: Secondary | ICD-10-CM

## 2019-12-06 DIAGNOSIS — Z23 Encounter for immunization: Secondary | ICD-10-CM

## 2019-12-06 MED ORDER — GUAIFENESIN-CODEINE 100-10 MG/5ML PO SYRP: 5.0000 mL | ORAL_SOLUTION | Freq: Four times a day (QID) | ORAL | 0 refills | Status: DC | PRN

## 2019-12-06 NOTE — Progress Notes (Signed)
Date:  12/06/2019   Name:  Marie Martin   DOB:  1931/12/10   MRN:  [id number]   Chief Complaint: COPD (congestion- would like cough syrup)  COPD She complains of cough, shortness of breath, sputum production and wheezing. There is no chest tightness, difficulty breathing, frequent throat clearing, hemoptysis or hoarse voice. This is a chronic problem. The current episode started more than 1 year ago. The problem occurs intermittently. The problem has been waxing and waning. The cough is productive of sputum. Pertinent negatives include no ear pain, fever, headaches, myalgias, rhinorrhea, sneezing or sore throat. Her symptoms are alleviated by beta-agonist, ipratropium and steroid inhaler (nebulizer twice a day). Her past medical history is significant for COPD.    Lab Results  Component Value Date   CREATININE 1.27 (H) 07/11/2019   BUN 22 07/11/2019   NA 142 07/11/2019   K 4.8 07/11/2019   CL 103 07/11/2019   CO2 23 07/11/2019   Lab Results  Component Value Date   CHOL 255 (H) 07/11/2019   HDL 61 07/11/2019   LDLCALC 169 (H) 07/11/2019   TRIG 141 07/11/2019   CHOLHDL 3.7 12/02/2015   No results found for: TSH No results found for: HGBA1C No results found for: WBC, HGB, HCT, MCV, PLT No results found for: ALT, AST, GGT, ALKPHOS, BILITOT   Review of Systems  Constitutional: Negative.  Negative for chills, fatigue, fever and unexpected weight change.  HENT: Negative for congestion, ear discharge, ear pain, hoarse voice, rhinorrhea, sinus pressure, sneezing and sore throat.   Eyes: Negative for photophobia, pain, discharge, redness and itching.  Respiratory: Positive for cough, sputum production, shortness of breath and wheezing. Negative for hemoptysis and stridor.   Gastrointestinal: Negative for abdominal pain, blood in stool, constipation, diarrhea, nausea and vomiting.  Endocrine: Negative for cold intolerance, heat intolerance, polydipsia, polyphagia and polyuria.    Genitourinary: Negative for dysuria, flank pain, frequency, hematuria, menstrual problem, pelvic pain, urgency, vaginal bleeding and vaginal discharge.  Musculoskeletal: Negative for arthralgias, back pain and myalgias.  Skin: Negative for rash.  Allergic/Immunologic: Negative for environmental allergies and food allergies.  Neurological: Negative for dizziness, weakness, light-headedness, numbness and headaches.  Hematological: Negative for adenopathy. Does not bruise/bleed easily.  Psychiatric/Behavioral: Negative for dysphoric mood. The patient is not nervous/anxious.     Patient Active Problem List   Diagnosis Date Noted  . COPD exacerbation (HCC) 02/22/2017  . Cough 02/22/2017  . Depression 02/22/2017  . SOB (shortness of breath) 02/22/2017  . 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 Recurrent major depressive episodes (HCC) 05/31/2014    Allergies  Allergen Reactions  . Amoxicillin-Pot Clavulanate Nausea Only and Other (See Comments)    Other Reaction: trouble swallowing  . Penicillins     Past Surgical History:  Procedure Laterality Date  . TONSILLECTOMY      Social History   Tobacco Use  . Smoking status: Former Games developer  . Smokeless tobacco: Never Used  Vaping Use  . Vaping Use: Never used  Substance Use Topics  . Alcohol use: No    Alcohol/week: 0.0 standard drinks  . Drug use: No     Medication list has been reviewed and updated.  Current Meds  Medication Sig  . albuterol (PROVENTIL) (2.5 MG/3ML) 0.083% nebulizer solution Take 3 mLs (2.5 mg total) by nebulization every 6 (six) hours as needed for wheezing or shortness of breath.  Marland Kitchen albuterol (VENTOLIN HFA) 108 (90 Base) MCG/ACT inhaler Inhale 2 puffs into the lungs  every 6 (six) hours as needed for wheezing or shortness of breath.  . Fluticasone-Salmeterol (ADVAIR DISKUS) 250-50 MCG/DOSE AEPB Inhale 1 puff into the lungs 2 (two) times daily.  . sertraline (ZOLOFT) 100 MG tablet TAKE 1 TABLET BY MOUTH  DAILY GENERIC EQUIVALENT FOR ZOLOFT  . SPIRIVA HANDIHALER 18 MCG inhalation capsule INHALE CONTENTS OF ONE CAPSULE BY MOUTH VIA HANDIHALER DAILY   Current Facility-Administered Medications for the 12/06/19 encounter (Office Visit) with Duanne Limerick, MD  Medication  . ipratropium-albuterol (DUONEB) 0.5-2.5 (3) MG/3ML nebulizer solution 3 mL    PHQ 2/9 Scores 09/11/2019 07/27/2019 07/11/2019 05/24/2019  PHQ - 2 Score 0 0 0 2  PHQ- 9 Score 0 0 0 3    GAD 7 : Generalized Anxiety Score 09/11/2019 07/27/2019 07/11/2019 02/14/2019  Nervous, Anxious, on Edge 0 0 0 1  Control/stop worrying 0 0 0 0  Worry too much - different things 0 0 0 0  Trouble relaxing 0 0 0 0  Restless 0 0 0 0  Easily annoyed or irritable 0 0 0 0  Afraid - awful might happen 0 0 0 0  Total GAD 7 Score 0 0 0 1  Anxiety Difficulty Not difficult at all - - Not difficult at all    BP Readings from Last 3 Encounters:  12/06/19 120/80  11/09/19 120/80  09/11/19 124/68    Physical Exam Vitals reviewed.  Constitutional:      Appearance: She is well-developed.  HENT:     Head: Normocephalic.     Right Ear: Tympanic membrane, ear canal and external ear normal. There is no impacted cerumen.     Left Ear: Tympanic membrane, ear canal and external ear normal. There is no impacted cerumen.     Nose: Nose normal.     Mouth/Throat:     Mouth: Mucous membranes are moist.     Pharynx: No oropharyngeal exudate or posterior oropharyngeal erythema.  Eyes:     General: Lids are everted, no foreign bodies appreciated. No scleral icterus.       Left eye: No foreign body or hordeolum.     Conjunctiva/sclera: Conjunctivae normal.     Right eye: Right conjunctiva is not injected.     Left eye: Left conjunctiva is not injected.     Pupils: Pupils are equal, round, and reactive to light.  Neck:     Thyroid: No thyromegaly.     Vascular: No JVD.     Trachea: No tracheal deviation.  Cardiovascular:     Rate and Rhythm: Normal rate and  regular rhythm.     Heart sounds: Normal heart sounds. No murmur heard.  No friction rub. No gallop.   Pulmonary:     Effort: Pulmonary effort is normal. No respiratory distress.     Breath sounds: Normal breath sounds. No wheezing, rhonchi or rales.  Abdominal:     General: Bowel sounds are normal.     Palpations: Abdomen is soft. There is no mass.     Tenderness: There is no abdominal tenderness. There is no guarding or rebound.  Musculoskeletal:        General: No tenderness. Normal range of motion.     Cervical back: Normal range of motion and neck supple.  Lymphadenopathy:     Cervical: No cervical adenopathy.  Skin:    General: Skin is warm.     Capillary Refill: Capillary refill takes less than 2 seconds.     Findings: No rash.  Neurological:  Mental Status: She is alert and oriented to person, place, and time.     Cranial Nerves: No cranial nerve deficit.     Deep Tendon Reflexes: Reflexes normal.  Psychiatric:        Mood and Affect: Mood is not anxious or depressed.     Wt Readings from Last 3 Encounters:  12/06/19 136 lb (61.7 kg)  11/09/19 140 lb (63.5 kg)  09/11/19 140 lb (63.5 kg)    BP 120/80   Pulse 64   Ht 5\' 6"  (1.676 m)   Wt 136 lb (61.7 kg)   SpO2 96% Comment: room air  BMI 21.95 kg/m   Assessment and Plan: 1. COPD (chronic obstructive pulmonary disease) with acute bronchitis (HCC) Chronic.  Relatively controlled.  Stable.  Patient is followed with Dr. and his notes are reviewed as well as the most recent chest x-ray and pulmonary function test.  Patient has been encouraged to continue her Breo, Spiriva, albuterol as needed via nebulization.  2. Cough Patient has had a cough the past couple days that is mildly productive which is probably her baseline.  We will treat with Robitussin-AC to be used mostly at night. - guaiFENesin-codeine (ROBITUSSIN AC) 100-10 MG/5ML syrup; Take 5 mLs by mouth 4 (four) times daily as needed for cough.   Dispense: 118 mL; Refill: 0  3. Need for immunization against influenza Discussed and administered. - Flu Vaccine QUAD High Dose(Fluad)

## 2019-12-11 DIAGNOSIS — J449 Chronic obstructive pulmonary disease, unspecified: Secondary | ICD-10-CM | POA: Diagnosis not present

## 2020-01-10 DIAGNOSIS — J449 Chronic obstructive pulmonary disease, unspecified: Secondary | ICD-10-CM | POA: Diagnosis not present

## 2020-01-11 ENCOUNTER — Ambulatory Visit: Payer: Medicare Other | Admitting: Family Medicine

## 2020-01-16 ENCOUNTER — Ambulatory Visit: Payer: Medicare Other | Admitting: Family Medicine

## 2020-01-29 ENCOUNTER — Telehealth: Payer: Self-pay

## 2020-01-29 NOTE — Telephone Encounter (Signed)
Sch appt per Dr Yetta Barre but pt still wants a cb from [facility] re something else. Pt stated she tried to answer phone but disconnected,She is sorry

## 2020-01-29 NOTE — Telephone Encounter (Signed)
Copied from CRM [id number]. Topic: Quick Communication - See Telephone Encounter >> Jan 29, 2020  8:59 AM Aretta Nip wrote: CRM for notification. See Telephone encounter for: 01/29/20. Pt statesTara to call back, but wants an appt with Dr Shela Commons for tomorrow, wants to explain she has chest congest, yellow fleam from COPD ,no other symptoms. Wants to come in late in the morning as driving from [place],

## 2020-01-29 NOTE — Telephone Encounter (Signed)
Please call and schedule er an appt for tomorrow- she can be 11 or [phone]

## 2020-01-29 NOTE — Telephone Encounter (Signed)
Called pt- will see her tomorrow

## 2020-01-30 ENCOUNTER — Other Ambulatory Visit: Payer: Self-pay

## 2020-01-30 ENCOUNTER — Ambulatory Visit (INDEPENDENT_AMBULATORY_CARE_PROVIDER_SITE_OTHER): Payer: Medicare Other | Admitting: Family Medicine

## 2020-01-30 ENCOUNTER — Encounter: Payer: Self-pay | Admitting: Family Medicine

## 2020-01-30 VITALS — BP 130/70 | HR 68 | Ht 66.0 in | Wt 130.0 lb

## 2020-01-30 DIAGNOSIS — J0100 Acute maxillary sinusitis, unspecified: Secondary | ICD-10-CM

## 2020-01-30 DIAGNOSIS — J209 Acute bronchitis, unspecified: Secondary | ICD-10-CM

## 2020-01-30 DIAGNOSIS — J440 Chronic obstructive pulmonary disease with acute lower respiratory infection: Secondary | ICD-10-CM | POA: Diagnosis not present

## 2020-01-30 MED ORDER — PREDNISONE 10 MG PO TABS: 10.0000 mg | ORAL_TABLET | Freq: Every day | ORAL | 0 refills | Status: DC

## 2020-01-30 MED ORDER — AZITHROMYCIN 250 MG PO TABS: ORAL_TABLET | ORAL | 0 refills | Status: DC

## 2020-01-30 MED ORDER — BENZONATATE 100 MG PO CAPS: 100.0000 mg | ORAL_CAPSULE | Freq: Two times a day (BID) | ORAL | 0 refills | Status: DC

## 2020-01-30 NOTE — Progress Notes (Signed)
Date:  01/30/2020   Name:  Marie Martin   DOB:  1931/07/30   MRN:  [id number]   Chief Complaint: Sinusitis (Cough and congestion- Mucinex D is helping but is still in her chest)  Sinusitis This is a new problem. The current episode started 1 to 4 weeks ago. The problem has been gradually improving since onset. There has been no fever. The pain is mild. Associated symptoms include congestion and sinus pressure. Pertinent negatives include no chills, coughing, diaphoresis, ear pain, headaches, hoarse voice, neck pain, shortness of breath, sneezing, sore throat or swollen glands. The treatment provided moderate relief.    Lab Results  Component Value Date   CREATININE 1.27 (H) 07/11/2019   BUN 22 07/11/2019   NA 142 07/11/2019   K 4.8 07/11/2019   CL 103 07/11/2019   CO2 23 07/11/2019   Lab Results  Component Value Date   CHOL 255 (H) 07/11/2019   HDL 61 07/11/2019   LDLCALC 169 (H) 07/11/2019   TRIG 141 07/11/2019   CHOLHDL 3.7 12/02/2015   No results found for: TSH No results found for: HGBA1C No results found for: WBC, HGB, HCT, MCV, PLT No results found for: ALT, AST, GGT, ALKPHOS, BILITOT   Review of Systems  Constitutional: Negative.  Negative for chills, diaphoresis, fatigue, fever and unexpected weight change.  HENT: Positive for congestion and sinus pressure. Negative for ear discharge, ear pain, hoarse voice, rhinorrhea, sneezing and sore throat.   Eyes: Negative for double vision, photophobia, pain, discharge, redness and itching.  Respiratory: Negative for cough, shortness of breath, wheezing and stridor.   Gastrointestinal: Negative for abdominal pain, blood in stool, constipation, diarrhea, nausea and vomiting.  Endocrine: Negative for cold intolerance, heat intolerance, polydipsia, polyphagia and polyuria.  Genitourinary: Negative for dysuria, flank pain, frequency, hematuria, menstrual problem, pelvic pain, urgency, vaginal bleeding and vaginal discharge.   Musculoskeletal: Negative for arthralgias, back pain, myalgias and neck pain.  Skin: Negative for rash.  Allergic/Immunologic: Negative for environmental allergies and food allergies.  Neurological: Negative for dizziness, weakness, light-headedness, numbness and headaches.  Hematological: Negative for adenopathy. Does not bruise/bleed easily.  Psychiatric/Behavioral: Negative for dysphoric mood. The patient is not nervous/anxious.     Patient Active Problem List   Diagnosis Date Noted  . COPD exacerbation (HCC) 02/22/2017  . Cough 02/22/2017  . Depression 02/22/2017  . SOB (shortness of breath) 02/22/2017  . 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 Recurrent major depressive episodes (HCC) 05/31/2014    Allergies  Allergen Reactions  . Amoxicillin-Pot Clavulanate Nausea Only and Other (See Comments)    Other Reaction: trouble swallowing  . Penicillins     Past Surgical History:  Procedure Laterality Date  . TONSILLECTOMY      Social History   Tobacco Use  . Smoking status: Former Games developer  . Smokeless tobacco: Never Used  Vaping Use  . Vaping Use: Never used  Substance Use Topics  . Alcohol use: No    Alcohol/week: 0.0 standard drinks  . Drug use: No     Medication list has been reviewed and updated.  Current Meds  Medication Sig  . albuterol (PROVENTIL) (2.5 MG/3ML) 0.083% nebulizer solution Take 3 mLs (2.5 mg total) by nebulization every 6 (six) hours as needed for wheezing or shortness of breath.  Marland Kitchen albuterol (VENTOLIN HFA) 108 (90 Base) MCG/ACT inhaler Inhale 2 puffs into the lungs every 6 (six) hours as needed for wheezing or shortness of breath.  . Fluticasone-Salmeterol (ADVAIR DISKUS)  250-50 MCG/DOSE AEPB Inhale 1 puff into the lungs 2 (two) times daily.  . sertraline (ZOLOFT) 100 MG tablet TAKE 1 TABLET BY MOUTH DAILY GENERIC EQUIVALENT FOR ZOLOFT  . SPIRIVA HANDIHALER 18 MCG inhalation capsule INHALE CONTENTS OF ONE CAPSULE BY MOUTH VIA HANDIHALER  DAILY   Current Facility-Administered Medications for the 01/30/20 encounter (Office Visit) with Duanne Limerick, MD  Medication  . ipratropium-albuterol (DUONEB) 0.5-2.5 (3) MG/3ML nebulizer solution 3 mL    PHQ 2/9 Scores 01/30/2020 09/11/2019 07/27/2019 07/11/2019  PHQ - 2 Score 0 0 0 0  PHQ- 9 Score 0 0 0 0    GAD 7 : Generalized Anxiety Score 01/30/2020 09/11/2019 07/27/2019 07/11/2019  Nervous, Anxious, on Edge 0 0 0 0  Control/stop worrying 0 0 0 0  Worry too much - different things 0 0 0 0  Trouble relaxing 0 0 0 0  Restless 0 0 0 0  Easily annoyed or irritable 0 0 0 0  Afraid - awful might happen 0 0 0 0  Total GAD 7 Score 0 0 0 0  Anxiety Difficulty - Not difficult at all - -    BP Readings from Last 3 Encounters:  01/30/20 130/70  12/06/19 120/80  11/09/19 120/80    Physical Exam Vitals and nursing note reviewed.  Constitutional:      Appearance: She is well-developed and well-nourished.  HENT:     Head: Normocephalic.     Right Ear: Tympanic membrane, ear canal and external ear normal.     Left Ear: Tympanic membrane, ear canal and external ear normal.     Nose: Nose normal.     Mouth/Throat:     Mouth: Oropharynx is clear and moist.  Eyes:     General: Lids are everted, no foreign bodies appreciated. No scleral icterus.       Left eye: No foreign body or hordeolum.     Extraocular Movements: EOM normal.     Conjunctiva/sclera: Conjunctivae normal.     Right eye: Right conjunctiva is not injected.     Left eye: Left conjunctiva is not injected.     Pupils: Pupils are equal, round, and reactive to light.  Neck:     Thyroid: No thyromegaly.     Vascular: No JVD.     Trachea: No tracheal deviation.  Cardiovascular:     Rate and Rhythm: Normal rate and regular rhythm.     Pulses: Intact distal pulses.     Heart sounds: Normal heart sounds. No murmur heard. No friction rub. No gallop.   Pulmonary:     Effort: Pulmonary effort is normal. No respiratory distress.      Breath sounds: Normal breath sounds. No wheezing, rhonchi or rales.  Abdominal:     General: Bowel sounds are normal.     Palpations: Abdomen is soft. There is no hepatosplenomegaly or mass.     Tenderness: There is no abdominal tenderness. There is no guarding or rebound.  Musculoskeletal:        General: No tenderness or edema. Normal range of motion.     Cervical back: Normal range of motion and neck supple.  Lymphadenopathy:     Cervical: No cervical adenopathy.  Skin:    General: Skin is warm.     Findings: No rash.  Neurological:     Mental Status: She is alert and oriented to person, place, and time.     Cranial Nerves: No cranial nerve deficit.     Deep Tendon  Reflexes: Strength normal. Reflexes normal.  Psychiatric:        Mood and Affect: Mood and affect normal. Mood is not anxious or depressed.     Wt Readings from Last 3 Encounters:  01/30/20 130 lb (59 kg)  12/06/19 136 lb (61.7 kg)  11/09/19 140 lb (63.5 kg)    BP 130/70   Pulse 68   Ht 5\' 6"  (1.676 m)   Wt 130 lb (59 kg)   SpO2 96%   BMI 20.98 kg/m   Assessment and Plan: 1. Acute maxillary sinusitis, recurrence not specified Acute.  Persistent.  Stable.  Patient's exam and history is consistent with an upper respiratory infection most likely maxillary sinusitis.  Will initiate a azithromycin to 50 mg 2 today followed by 1 a day for 4 days. - azithromycin (ZITHROMAX) 250 MG tablet; 2 today then 1 a day for 4 days  Dispense: 6 tablet; Refill: 0  2. COPD (chronic obstructive pulmonary disease) with acute bronchitis (HCC) Chronic.  Episodic.  Persistent.  Likely an exacerbation of her COPD.  Patient is currently on Advair and is Spiriva.  Patient has been given a Trelegy sample to initiate for the next 2 weeks and then may return to her previous pulmonary regimen.  We will also initiate some prednisone 10 mg once a day as well as some Tessalon Perles for the cough. - predniSONE (DELTASONE) 10 MG tablet; Take  1 tablet (10 mg total) by mouth daily with breakfast.  Dispense: 30 tablet; Refill: 0 - benzonatate (TESSALON) 100 MG capsule; Take 1 capsule (100 mg total) by mouth 2 (two) times daily.  Dispense: 20 capsule; Refill: 0

## 2020-02-10 DIAGNOSIS — J449 Chronic obstructive pulmonary disease, unspecified: Secondary | ICD-10-CM | POA: Diagnosis not present

## 2020-02-14 ENCOUNTER — Other Ambulatory Visit: Payer: Self-pay | Admitting: Family Medicine

## 2020-02-14 ENCOUNTER — Telehealth: Payer: Self-pay | Admitting: Family Medicine

## 2020-02-14 DIAGNOSIS — J441 Chronic obstructive pulmonary disease with (acute) exacerbation: Secondary | ICD-10-CM

## 2020-02-14 NOTE — Telephone Encounter (Signed)
Pt states she just got off phone with her insurance company about a new medication Dr Yetta Barre prescribed her..  When I asked her which med, she said she did not know because it is new. But she wants Delice Bison or Dr Yetta Barre to call her back to discuss this medication.

## 2020-02-14 NOTE — Telephone Encounter (Signed)
Patient is calling again to check the status of her earlier message.  She still has not heard from the doctor.  Please advise

## 2020-02-15 ENCOUNTER — Other Ambulatory Visit: Payer: Self-pay

## 2020-02-15 DIAGNOSIS — J441 Chronic obstructive pulmonary disease with (acute) exacerbation: Secondary | ICD-10-CM

## 2020-02-15 MED ORDER — TRELEGY ELLIPTA 100-62.5-25 MCG/INH IN AEPB: 1.0000 | INHALATION_SPRAY | Freq: Every day | RESPIRATORY_TRACT | 0 refills | Status: DC

## 2020-02-15 NOTE — Telephone Encounter (Signed)
PT calling to f/up / please advise  

## 2020-02-16 ENCOUNTER — Telehealth: Payer: Self-pay

## 2020-02-16 NOTE — Telephone Encounter (Signed)
Spoke to pt

## 2020-02-16 NOTE — Telephone Encounter (Signed)
Left patient a voice mail in regards to [name]'s message.

## 2020-02-16 NOTE — Telephone Encounter (Signed)
Please call her and tell her it is important that if she get short of breath, to where her inhalers are NOT helping, or she develops chest pain, she needs to go to the [facility]. Otherwise push fluids, and rest. Take tylenol for fever

## 2020-02-16 NOTE — Telephone Encounter (Signed)
Pt states she missed [name]'s call and would like a call back.

## 2020-02-16 NOTE — Telephone Encounter (Signed)
Copied from CRM [id number]. Topic: General - Other >> Feb 15, 2020  4:28 PM Marie Martin wrote: Reason for CRM: Patient called to inform the doctor that her home test is positive for covid.  Please call patient to answer some questions since she has COPD.  CB# [phone]

## 2020-03-11 ENCOUNTER — Other Ambulatory Visit: Payer: Self-pay | Admitting: Family Medicine

## 2020-03-11 DIAGNOSIS — J441 Chronic obstructive pulmonary disease with (acute) exacerbation: Secondary | ICD-10-CM

## 2020-03-11 NOTE — Telephone Encounter (Signed)
Medication Refill - Medication: Trelegy   Has the patient contacted their pharmacy? Yes.   Pt is using a new pharmacy and is requesting to have this sent over. Please advise.  (Agent: If no, request that the patient contact the pharmacy for the refill.) (Agent: If yes, when and what did the pharmacy advise?)  Preferred Pharmacy (with phone number or street name):  [facility] - [place], [place] - [address]  [address] [place] [address]  Phone: [phone] Fax: [phone]  Hours: Not open 24 hours     Agent: Please be advised that RX refills may take up to 3 business days. We ask that you follow-up with your pharmacy.

## 2020-03-12 ENCOUNTER — Telehealth: Payer: Self-pay

## 2020-03-12 MED ORDER — TRELEGY ELLIPTA 100-62.5-25 MCG/INH IN AEPB: 1.0000 | INHALATION_SPRAY | Freq: Every day | RESPIRATORY_TRACT | 0 refills | Status: DC

## 2020-03-12 NOTE — Telephone Encounter (Signed)
Requested medication (s) are due for refill today:     Requested medication (s) are on the active medication list:   Yes  Future visit scheduled:   No  seen a month ago   Last ordered: 02/15/2020 60 each, 0 refills  Clinic note:  Returned because there is not a protocol assigned to this medication.   Requested Prescriptions  Pending Prescriptions Disp Refills   Fluticasone-Umeclidin-Vilant (TRELEGY ELLIPTA) 100-62.5-25 MCG/INH AEPB 60 each 0    Sig: Inhale 1 puff into the lungs daily.      Off-Protocol Failed - 03/11/2020  5:04 PM      Failed - Medication not assigned to a protocol, review manually.      Passed - Valid encounter within last 12 months    Recent Outpatient Visits           1 month ago Acute maxillary sinusitis, recurrence not specified   [facility] Duanne Limerick, MD   3 months ago COPD (chronic obstructive pulmonary disease) with acute bronchitis (HCC)   [facility] Duanne Limerick, MD   4 months ago COPD (chronic obstructive pulmonary disease) with acute bronchitis (HCC)   [facility] Duanne Limerick, MD   6 months ago Chronic obstructive pulmonary disease with acute exacerbation (HCC)   [facility] Duanne Limerick, MD   7 months ago COPD (chronic obstructive pulmonary disease) with acute bronchitis [facility])   [facility] Duanne Limerick, MD

## 2020-03-12 NOTE — Telephone Encounter (Unsigned)
Copied from CRM [id number]. Topic: General - Other >> Mar 12, 2020  9:43 AM Jaquita Rector A wrote: Reason for CRM: Patient called in requesting a call back from [facility] Dr Yetta Barre nurse. Did not want to say what this was about. Please call at Ph# [phone]

## 2020-03-12 NOTE — Telephone Encounter (Signed)
Called pt and discussed inhaler costs

## 2020-03-25 ENCOUNTER — Telehealth: Payer: Self-pay | Admitting: Family Medicine

## 2020-03-25 NOTE — Telephone Encounter (Signed)
PT is calling regarding a denial of DOS 03/12/20 for her her oxygen tanks. PT is requesting Delice Bison to call her [employer]- Claim Number [id number] [employer] [phone] Pt CB- [phone]

## 2020-03-26 NOTE — Telephone Encounter (Signed)
Left message with patient voicemail about pulmonology.

## 2020-03-26 NOTE — Telephone Encounter (Signed)
Please call her and tell her that I did receive her message, but that would be a pulmonology issue/ her lung doctor. She needs to call Dr Reita Cliche office at [phone]

## 2020-04-02 ENCOUNTER — Telehealth: Payer: Self-pay

## 2020-04-02 ENCOUNTER — Other Ambulatory Visit: Payer: Self-pay | Admitting: Family Medicine

## 2020-04-02 DIAGNOSIS — F339 Major depressive disorder, recurrent, unspecified: Secondary | ICD-10-CM

## 2020-04-02 NOTE — Telephone Encounter (Signed)
Copied from CRM [id number]. Topic: General - Other >> Apr 02, 2020 11:47 AM Tamela Oddi wrote: Reason for CRM: Patient is calling to ask Delice Bison to call her regarding a fax that her pharmacy said was sent for her medication.  Please call patient at [phone] to confirm receipt of the fax.

## 2020-04-03 NOTE — Telephone Encounter (Signed)
Called and left message.

## 2020-04-03 NOTE — Telephone Encounter (Signed)
Spoke to pt

## 2020-04-03 NOTE — Telephone Encounter (Signed)
Patient returning call.

## 2020-04-12 ENCOUNTER — Other Ambulatory Visit: Payer: Self-pay | Admitting: Family Medicine

## 2020-04-12 DIAGNOSIS — J441 Chronic obstructive pulmonary disease with (acute) exacerbation: Secondary | ICD-10-CM

## 2020-04-12 MED ORDER — TRELEGY ELLIPTA 100-62.5-25 MCG/INH IN AEPB: 1.0000 | INHALATION_SPRAY | Freq: Every day | RESPIRATORY_TRACT | 0 refills | Status: DC

## 2020-04-12 NOTE — Telephone Encounter (Signed)
Requested medication (s) are due for refill today - yes  Requested medication (s) are on the active medication list -yes  Future visit scheduled -no  Last refill: 03/12/20  Notes to clinic: Request medication not assigned protocol  Requested Prescriptions  Pending Prescriptions Disp Refills   Fluticasone-Umeclidin-Vilant (TRELEGY ELLIPTA) 100-62.5-25 MCG/INH AEPB 60 each 0    Sig: Inhale 1 puff into the lungs daily.      Off-Protocol Failed - 04/12/2020  8:58 AM      Failed - Medication not assigned to a protocol, review manually.      Passed - Valid encounter within last 12 months    Recent Outpatient Visits           2 months ago Acute maxillary sinusitis, recurrence not specified   [facility] Duanne Limerick, MD   4 months ago COPD (chronic obstructive pulmonary disease) with acute bronchitis (HCC)   [facility] Duanne Limerick, MD   5 months ago COPD (chronic obstructive pulmonary disease) with acute bronchitis (HCC)   [facility] Duanne Limerick, MD   7 months ago Chronic obstructive pulmonary disease with acute exacerbation (HCC)   [facility] Duanne Limerick, MD   8 months ago COPD (chronic obstructive pulmonary disease) with acute bronchitis (HCC)   [facility] Duanne Limerick, MD                    Requested Prescriptions  Pending Prescriptions Disp Refills   Fluticasone-Umeclidin-Vilant (TRELEGY ELLIPTA) 100-62.5-25 MCG/INH AEPB 60 each 0    Sig: Inhale 1 puff into the lungs daily.      Off-Protocol Failed - 04/12/2020  8:58 AM      Failed - Medication not assigned to a protocol, review manually.      Passed - Valid encounter within last 12 months    Recent Outpatient Visits           2 months ago Acute maxillary sinusitis, recurrence not specified   [facility] Duanne Limerick, MD   4 months ago COPD (chronic obstructive pulmonary disease) with acute bronchitis (HCC)   [facility]  Duanne Limerick, MD   5 months ago COPD (chronic obstructive pulmonary disease) with acute bronchitis (HCC)   [facility] Duanne Limerick, MD   7 months ago Chronic obstructive pulmonary disease with acute exacerbation (HCC)   [facility] Duanne Limerick, MD   8 months ago COPD (chronic obstructive pulmonary disease) with acute bronchitis [facility])   [facility] Duanne Limerick, MD

## 2020-04-12 NOTE — Telephone Encounter (Signed)
Medication Refill - Medication: Trelegy   Has the patient contacted their pharmacy? Yes.   Pt states that pharmacy asked her to contact PCP. Please advise.  (Agent: If no, request that the patient contact the pharmacy for the refill.) (Agent: If yes, when and what did the pharmacy advise?)  Preferred Pharmacy (with phone number or street name):  [facility] [address] [place] - [address]  [address] [place] [address]  Phone: [phone] Fax: [phone]  Hours: Not open 24 hours     Agent: Please be advised that RX refills may take up to 3 business days. We ask that you follow-up with your pharmacy.

## 2020-04-16 ENCOUNTER — Other Ambulatory Visit: Payer: Self-pay

## 2020-04-16 ENCOUNTER — Ambulatory Visit (INDEPENDENT_AMBULATORY_CARE_PROVIDER_SITE_OTHER): Payer: Medicare Other | Admitting: Family Medicine

## 2020-04-16 ENCOUNTER — Encounter: Payer: Self-pay | Admitting: Family Medicine

## 2020-04-16 VITALS — BP 120/70 | HR 72 | Temp 98.0°F | Ht 66.0 in | Wt 127.0 lb

## 2020-04-16 DIAGNOSIS — J440 Chronic obstructive pulmonary disease with acute lower respiratory infection: Secondary | ICD-10-CM | POA: Diagnosis not present

## 2020-04-16 DIAGNOSIS — J0100 Acute maxillary sinusitis, unspecified: Secondary | ICD-10-CM

## 2020-04-16 DIAGNOSIS — J209 Acute bronchitis, unspecified: Secondary | ICD-10-CM | POA: Diagnosis not present

## 2020-04-16 MED ORDER — AZITHROMYCIN 250 MG PO TABS: ORAL_TABLET | ORAL | 0 refills | Status: DC

## 2020-04-16 MED ORDER — PREDNISONE 10 MG PO TABS: 10.0000 mg | ORAL_TABLET | Freq: Every day | ORAL | 0 refills | Status: DC

## 2020-04-16 NOTE — Progress Notes (Signed)
Date:  04/16/2020   Name:  Marie Martin   DOB:  Sep 22, 1931   MRN:  [id number]   Chief Complaint: Sinusitis (Cough with yellow production x 3 days. )  Sinusitis This is a new problem. The current episode started in the past 7 days. The problem has been gradually improving since onset. There has been no fever. The pain is moderate. Associated symptoms include congestion, coughing, shortness of breath, sinus pressure and sneezing. Pertinent negatives include no chills, diaphoresis, ear pain, headaches, hoarse voice, neck pain, sore throat or swollen glands. Treatments tried: treledgy. The treatment provided moderate relief.    Lab Results  Component Value Date   CREATININE 1.27 (H) 07/11/2019   BUN 22 07/11/2019   NA 142 07/11/2019   K 4.8 07/11/2019   CL 103 07/11/2019   CO2 23 07/11/2019   Lab Results  Component Value Date   CHOL 255 (H) 07/11/2019   HDL 61 07/11/2019   LDLCALC 169 (H) 07/11/2019   TRIG 141 07/11/2019   CHOLHDL 3.7 12/02/2015   No results found for: TSH No results found for: HGBA1C No results found for: WBC, HGB, HCT, MCV, PLT No results found for: ALT, AST, GGT, ALKPHOS, BILITOT   Review of Systems  Constitutional: Negative.  Negative for chills, diaphoresis, fatigue, fever and unexpected weight change.  HENT: Positive for congestion, postnasal drip, rhinorrhea, sinus pressure and sneezing. Negative for ear discharge, ear pain, hoarse voice and sore throat.   Eyes: Negative for double vision, photophobia, pain, discharge, redness and itching.  Respiratory: Positive for cough, shortness of breath and wheezing. Negative for stridor.   Gastrointestinal: Negative for abdominal pain, blood in stool, constipation, diarrhea, nausea and vomiting.  Endocrine: Negative for cold intolerance, heat intolerance, polydipsia, polyphagia and polyuria.  Genitourinary: Negative for dysuria, flank pain, frequency, hematuria, menstrual problem, pelvic pain, urgency, vaginal  bleeding and vaginal discharge.  Musculoskeletal: Negative for arthralgias, back pain, myalgias and neck pain.  Skin: Negative for rash.  Allergic/Immunologic: Negative for environmental allergies and food allergies.  Neurological: Negative for dizziness, weakness, light-headedness, numbness and headaches.  Hematological: Negative for adenopathy. Does not bruise/bleed easily.  Psychiatric/Behavioral: Negative for dysphoric mood. The patient is not nervous/anxious.     Patient Active Problem List   Diagnosis Date Noted  . COPD exacerbation (HCC) 02/22/2017  . Cough 02/22/2017  . Depression 02/22/2017  . SOB (shortness of breath) 02/22/2017  . 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 Recurrent major depressive episodes (HCC) 05/31/2014    Allergies  Allergen Reactions  . Amoxicillin-Pot Clavulanate Nausea Only and Other (See Comments)    Other Reaction: trouble swallowing  . Penicillins     Past Surgical History:  Procedure Laterality Date  . TONSILLECTOMY      Social History   Tobacco Use  . Smoking status: Former Games developer  . Smokeless tobacco: Never Used  Vaping Use  . Vaping Use: Never used  Substance Use Topics  . Alcohol use: No    Alcohol/week: 0.0 standard drinks  . Drug use: No     Medication list has been reviewed and updated.  Current Meds  Medication Sig  . albuterol (PROVENTIL) (2.5 MG/3ML) 0.083% nebulizer solution USE 1 VIAL IN NEBULIZER EVERY 6 HOURS AS NEEDED FOR WHEEZING FOR SHORTNESS OF BREATH  . albuterol (VENTOLIN HFA) 108 (90 Base) MCG/ACT inhaler Inhale 2 puffs into the lungs every 6 (six) hours as needed for wheezing or shortness of breath.  . Fluticasone-Umeclidin-Vilant (TRELEGY ELLIPTA) 100-62.5-25 MCG/INH  AEPB Inhale 1 puff into the lungs daily.  . sertraline (ZOLOFT) 100 MG tablet TAKE 1 TABLET BY MOUTH DAILY. GENERIC EQUIVALENT FOR ZOLOFT   Current Facility-Administered Medications for the 04/16/20 encounter (Office Visit) with Duanne Limerick, MD  Medication  . ipratropium-albuterol (DUONEB) 0.5-2.5 (3) MG/3ML nebulizer solution 3 mL    PHQ 2/9 Scores 01/30/2020 09/11/2019 07/27/2019 07/11/2019  PHQ - 2 Score 0 0 0 0  PHQ- 9 Score 0 0 0 0    GAD 7 : Generalized Anxiety Score 01/30/2020 09/11/2019 07/27/2019 07/11/2019  Nervous, Anxious, on Edge 0 0 0 0  Control/stop worrying 0 0 0 0  Worry too much - different things 0 0 0 0  Trouble relaxing 0 0 0 0  Restless 0 0 0 0  Easily annoyed or irritable 0 0 0 0  Afraid - awful might happen 0 0 0 0  Total GAD 7 Score 0 0 0 0  Anxiety Difficulty - Not difficult at all - -    BP Readings from Last 3 Encounters:  04/16/20 120/70  01/30/20 130/70  12/06/19 120/80    Physical Exam Vitals and nursing note reviewed.  Constitutional:      General: She is not in acute distress.    Appearance: She is not diaphoretic.  HENT:     Head: Normocephalic and atraumatic.     Salivary Glands: Right salivary gland is tender. Left salivary gland is tender.     Right Ear: Tympanic membrane and external ear normal.     Left Ear: Tympanic membrane and external ear normal.     Nose: Nose normal.     Right Turbinates: Swollen.     Left Turbinates: Swollen.     Mouth/Throat:     Mouth: Oropharynx is clear and moist.     Pharynx: Oropharynx is clear. Uvula midline.  Eyes:     General:        Right eye: No discharge.        Left eye: No discharge.     Extraocular Movements: EOM normal.     Conjunctiva/sclera: Conjunctivae normal.     Pupils: Pupils are equal, round, and reactive to light.  Neck:     Thyroid: No thyromegaly.     Vascular: No JVD.  Cardiovascular:     Rate and Rhythm: Normal rate and regular rhythm.     Pulses: Intact distal pulses.     Heart sounds: Normal heart sounds. No murmur heard. No friction rub. No gallop.   Pulmonary:     Effort: Pulmonary effort is normal.     Breath sounds: No decreased air movement. Wheezing present. No decreased breath sounds, rhonchi  or rales.  Abdominal:     General: Bowel sounds are normal.     Palpations: Abdomen is soft. There is no mass.     Tenderness: There is no abdominal tenderness. There is no guarding.  Musculoskeletal:        General: No edema. Normal range of motion.     Cervical back: Normal range of motion and neck supple.  Lymphadenopathy:     Cervical: No cervical adenopathy.  Skin:    General: Skin is warm and dry.  Neurological:     Mental Status: She is alert.     Deep Tendon Reflexes: Reflexes are normal and symmetric.     Wt Readings from Last 3 Encounters:  04/16/20 127 lb (57.6 kg)  01/30/20 130 lb (59 kg)  12/06/19 136 lb (61.7  kg)    BP 120/70   Pulse 72   Temp 98 F (36.7 C) (Oral)   Ht 5\' 6"  (1.676 m)   Wt 127 lb (57.6 kg)   SpO2 96%   BMI 20.50 kg/m   Assessment and Plan: 1. COPD (chronic obstructive pulmonary disease) with acute bronchitis (HCC) Chronic.  Controlled.  Stable.  Patient has on baseline with Trelegy once a day but is been taking her albuterol episodically.  I have reemphasized the importance of taking her albuterol on a more frequent basis during the early part of the spring with the pollen counts being at maximum.  We will initiate some prednisone 10 mg once a day for 2 weeks and also encouraged to use Mucinex DM. - predniSONE (DELTASONE) 10 MG tablet; Take 1 tablet (10 mg total) by mouth daily with breakfast.  Dispense: 30 tablet; Refill: 0  2. Acute maxillary sinusitis, recurrence not specified New onset persistent.  Stable.  Patient has exam and history consistent with an acute maxillary sinusitis we will initiate a azithromycin to 50 mg 2 today followed by 1 a day for 4 days. - azithromycin (ZITHROMAX) 250 MG tablet; 2 today then 1 a day for 4 days  Dispense: 6 tablet; Refill: 0

## 2020-05-09 ENCOUNTER — Other Ambulatory Visit: Payer: Self-pay | Admitting: Family Medicine

## 2020-05-09 DIAGNOSIS — J441 Chronic obstructive pulmonary disease with (acute) exacerbation: Secondary | ICD-10-CM

## 2020-05-09 MED ORDER — TRELEGY ELLIPTA 100-62.5-25 MCG/INH IN AEPB: 1.0000 | INHALATION_SPRAY | Freq: Every day | RESPIRATORY_TRACT | 0 refills | Status: DC

## 2020-05-09 NOTE — Telephone Encounter (Signed)
Medication Refill - Medication:   Fluticasone-Umeclidin-Vilant (TRELEGY ELLIPTA) 100-62.5-25 MCG/INH AEPB    Has the patient contacted their pharmacy? Yes.  contact office.  (Agent: If no, request that the patient contact the pharmacy for the refill.) (Agent: If yes, when and what did the pharmacy advise?)  Preferred Pharmacy (with phone number or street name):   [facility] [address] [place] - [address]  [address] [place] [address]  Phone:  [phone] Fax:  [phone]   Agent: Please be advised that RX refills may take up to 3 business days. We ask that you follow-up with your pharmacy.

## 2020-05-09 NOTE — Telephone Encounter (Signed)
Requested medication (s) are due for refill today: yes  Requested medication (s) are on the active medication list: yes  Last refill:  04/12/20  Future visit scheduled: no  Notes to clinic:  no assigned protocol    Requested Prescriptions  Pending Prescriptions Disp Refills   Fluticasone-Umeclidin-Vilant (TRELEGY ELLIPTA) 100-62.5-25 MCG/INH AEPB 60 each 0    Sig: Inhale 1 puff into the lungs daily.      Off-Protocol Failed - 05/09/2020 11:45 AM      Failed - Medication not assigned to a protocol, review manually.      Passed - Valid encounter within last 12 months    Recent Outpatient Visits           3 weeks ago COPD (chronic obstructive pulmonary disease) with acute bronchitis (HCC)   [facility] Duanne Limerick, MD   3 months ago Acute maxillary sinusitis, recurrence not specified   [facility] Duanne Limerick, MD   5 months ago COPD (chronic obstructive pulmonary disease) with acute bronchitis (HCC)   [facility] Duanne Limerick, MD   6 months ago COPD (chronic obstructive pulmonary disease) with acute bronchitis (HCC)   [facility] Duanne Limerick, MD   8 months ago Chronic obstructive pulmonary disease with acute exacerbation [facility])   [facility] Duanne Limerick, MD

## 2020-05-29 ENCOUNTER — Ambulatory Visit: Payer: Medicare Other

## 2020-06-03 ENCOUNTER — Ambulatory Visit (INDEPENDENT_AMBULATORY_CARE_PROVIDER_SITE_OTHER): Payer: Medicare Other

## 2020-06-03 ENCOUNTER — Other Ambulatory Visit: Payer: Self-pay

## 2020-06-03 VITALS — BP 128/72 | HR 65 | Temp 98.3°F | Resp 16 | Ht 66.0 in | Wt 125.8 lb

## 2020-06-03 DIAGNOSIS — Z0000 Encounter for general adult medical examination without abnormal findings: Secondary | ICD-10-CM

## 2020-06-03 NOTE — Patient Instructions (Signed)
Marie Martin , Thank you for taking time to come for your Medicare Wellness Visit. I appreciate your ongoing commitment to your health goals. Please review the following plan we discussed and let me know if I can assist you in the future.   Screening recommendations/referrals: Colonoscopy: no longer required Mammogram: no longer required Bone Density: no longer required Recommended yearly ophthalmology/optometry visit for glaucoma screening and checkup Recommended yearly dental visit for hygiene and checkup  Vaccinations: Influenza vaccine: done 12/06/19 Pneumococcal vaccine: done 10/01/16 Tdap vaccine: due Shingles vaccine: Shingrix discussed. Please contact your pharmacy for coverage information.  Covid-19:done 04/19/19, 05/17/19 & 11/18/19  Advanced directives: Advance directive discussed with you today. I have provided a copy for you to complete at home and have notarized. Once this is complete please bring a copy in to our office so we can scan it into your chart.  Conditions/risks identified: Recommend drinking 6-8 glasses of water per day   Next appointment: Follow up in one year for your annual wellness visit    Preventive Care 65 Years and Older, Female Preventive care refers to lifestyle choices and visits with your health care provider that can promote health and wellness. What does preventive care include?  A yearly physical exam. This is also called an annual well check.  Dental exams once or twice a year.  Routine eye exams. Ask your health care provider how often you should have your eyes checked.  Personal lifestyle choices, including:  Daily care of your teeth and gums.  Regular physical activity.  Eating a healthy diet.  Avoiding tobacco and drug use.  Limiting alcohol use.  Practicing safe sex.  Taking low-dose aspirin every day.  Taking vitamin and mineral supplements as recommended by your health care provider. What happens during an annual well  check? The services and screenings done by your health care provider during your annual well check will depend on your age, overall health, lifestyle risk factors, and family history of disease. Counseling  Your health care provider may ask you questions about your:  Alcohol use.  Tobacco use.  Drug use.  Emotional well-being.  Home and relationship well-being.  Sexual activity.  Eating habits.  History of falls.  Memory and ability to understand (cognition).  Work and work Astronomer.  Reproductive health. Screening  You may have the following tests or measurements:  Height, weight, and BMI.  Blood pressure.  Lipid and cholesterol levels. These may be checked every 5 years, or more frequently if you are over 19 years old.  Skin check.  Lung cancer screening. You may have this screening every year starting at age 6 if you have a 30-pack-year history of smoking and currently smoke or have quit within the past 15 years.  Fecal occult blood test (FOBT) of the stool. You may have this test every year starting at age 67.  Flexible sigmoidoscopy or colonoscopy. You may have a sigmoidoscopy every 5 years or a colonoscopy every 10 years starting at age 62.  Hepatitis C blood test.  Hepatitis B blood test.  Sexually transmitted disease (STD) testing.  Diabetes screening. This is done by checking your blood sugar (glucose) after you have not eaten for a while (fasting). You may have this done every 1-3 years.  Bone density scan. This is done to screen for osteoporosis. You may have this done starting at age 73.  Mammogram. This may be done every 1-2 years. Talk to your health care provider about how often you should have  regular mammograms. Talk with your health care provider about your test results, treatment options, and if necessary, the need for more tests. Vaccines  Your health care provider may recommend certain vaccines, such as:  Influenza vaccine. This is  recommended every year.  Tetanus, diphtheria, and acellular pertussis (Tdap, Td) vaccine. You may need a Td booster every 10 years.  Zoster vaccine. You may need this after age 28.  Pneumococcal 13-valent conjugate (PCV13) vaccine. One dose is recommended after age 55.  Pneumococcal polysaccharide (PPSV23) vaccine. One dose is recommended after age 57. Talk to your health care provider about which screenings and vaccines you need and how often you need them. This information is not intended to replace advice given to you by your health care provider. Make sure you discuss any questions you have with your health care provider. Document Released: 02/22/2015 Document Revised: 10/16/2015 Document Reviewed: 11/27/2014 Elsevier Interactive Patient Education  2017 [place] Prevention in the Home Falls can cause injuries. They can happen to people of all ages. There are many things you can do to make your home safe and to help prevent falls. What can I do on the outside of my home?  Regularly fix the edges of walkways and driveways and fix any cracks.  Remove anything that might make you trip as you walk through a door, such as a raised step or threshold.  Trim any bushes or trees on the path to your home.  Use bright outdoor lighting.  Clear any walking paths of anything that might make someone trip, such as rocks or tools.  Regularly check to see if handrails are loose or broken. Make sure that both sides of any steps have handrails.  Any raised decks and porches should have guardrails on the edges.  Have any leaves, snow, or ice cleared regularly.  Use sand or salt on walking paths during winter.  Clean up any spills in your garage right away. This includes oil or grease spills. What can I do in the bathroom?  Use night lights.  Install grab bars by the toilet and in the tub and shower. Do not use towel bars as grab bars.  Use non-skid mats or decals in the tub or  shower.  If you need to sit down in the shower, use a plastic, non-slip stool.  Keep the floor dry. Clean up any water that spills on the floor as soon as it happens.  Remove soap buildup in the tub or shower regularly.  Attach bath mats securely with double-sided non-slip rug tape.  Do not have throw rugs and other things on the floor that can make you trip. What can I do in the bedroom?  Use night lights.  Make sure that you have a light by your bed that is easy to reach.  Do not use any sheets or blankets that are too big for your bed. They should not hang down onto the floor.  Have a firm chair that has side arms. You can use this for support while you get dressed.  Do not have throw rugs and other things on the floor that can make you trip. What can I do in the kitchen?  Clean up any spills right away.  Avoid walking on wet floors.  Keep items that you use a lot in easy-to-reach places.  If you need to reach something above you, use a strong step stool that has a grab bar.  Keep electrical cords out of the  way.  Do not use floor polish or wax that makes floors slippery. If you must use wax, use non-skid floor wax.  Do not have throw rugs and other things on the floor that can make you trip. What can I do with my stairs?  Do not leave any items on the stairs.  Make sure that there are handrails on both sides of the stairs and use them. Fix handrails that are broken or loose. Make sure that handrails are as long as the stairways.  Check any carpeting to make sure that it is firmly attached to the stairs. Fix any carpet that is loose or worn.  Avoid having throw rugs at the top or bottom of the stairs. If you do have throw rugs, attach them to the floor with carpet tape.  Make sure that you have a light switch at the top of the stairs and the bottom of the stairs. If you do not have them, ask someone to add them for you. What else can I do to help prevent  falls?  Wear shoes that:  Do not have high heels.  Have rubber bottoms.  Are comfortable and fit you well.  Are closed at the toe. Do not wear sandals.  If you use a stepladder:  Make sure that it is fully opened. Do not climb a closed stepladder.  Make sure that both sides of the stepladder are locked into place.  Ask someone to hold it for you, if possible.  Clearly mark and make sure that you can see:  Any grab bars or handrails.  First and last steps.  Where the edge of each step is.  Use tools that help you move around (mobility aids) if they are needed. These include:  Canes.  Walkers.  Scooters.  Crutches.  Turn on the lights when you go into a dark area. Replace any light bulbs as soon as they burn out.  Set up your furniture so you have a clear path. Avoid moving your furniture around.  If any of your floors are uneven, fix them.  If there are any pets around you, be aware of where they are.  Review your medicines with your doctor. Some medicines can make you feel dizzy. This can increase your chance of falling. Ask your doctor what other things that you can do to help prevent falls. This information is not intended to replace advice given to you by your health care provider. Make sure you discuss any questions you have with your health care provider. Document Released: 11/22/2008 Document Revised: 07/04/2015 Document Reviewed: 03/02/2014 Elsevier Interactive Patient Education  2017 [employer].

## 2020-06-03 NOTE — Progress Notes (Addendum)
Subjective:   Marie Martin is a 87 y.o. female who presents for Medicare Annual (Subsequent) preventive examination.  Review of Systems     Cardiac Risk Factors include: advanced age (>76men, >95 women)     Objective:    Today's Vitals   06/03/20 1356  BP: 128/72  Pulse: 65  Resp: 16  Temp: 98.3 F (36.8 C)  TempSrc: Oral  SpO2: 93%  Weight: 125 lb 12.8 oz (57.1 kg)  Height: 5\' 6"  (1.676 m)   Body mass index is 20.3 kg/m.  Advanced Directives 06/03/2020 05/24/2019 12/14/2014  Does Patient Have a Medical Advance Directive? No No Yes  Type of Advance Directive - 13/05/2014;Living will  Copy of Healthcare Power of Attorney in Chart? - - No - copy requested  Would patient like information on creating a medical advance directive? Yes (MAU/Ambulatory/Procedural Areas - Information given) Yes (MAU/Ambulatory/Procedural Areas - Information given) -    Current Medications (verified) Outpatient Encounter Medications as of 06/03/2020  Medication Sig  . albuterol (PROVENTIL) (2.5 MG/3ML) 0.083% nebulizer solution USE 1 VIAL IN NEBULIZER EVERY 6 HOURS AS NEEDED FOR WHEEZING FOR SHORTNESS OF BREATH  . albuterol (VENTOLIN HFA) 108 (90 Base) MCG/ACT inhaler Inhale 2 puffs into the lungs every 6 (six) hours as needed for wheezing or shortness of breath.  . Ascorbic Acid (VITAMIN C WITH ROSE HIPS) 1000 MG tablet Take 1,000 mg by mouth daily.  . Fluticasone-Umeclidin-Vilant (TRELEGY ELLIPTA) 100-62.5-25 MCG/INH AEPB Inhale 1 puff into the lungs daily.  . Garlic 100 MG TABS Take by mouth.  . Multiple Vitamins-Minerals (MULTIVITAMIN WOMENS 50+ ADV) TABS Take by mouth.  . sertraline (ZOLOFT) 100 MG tablet TAKE 1 TABLET BY MOUTH DAILY. GENERIC EQUIVALENT FOR ZOLOFT  . vitamin B-12 (CYANOCOBALAMIN) 1000 MCG tablet Take 1,000 mcg by mouth daily.  . vitamin E (VITAMIN E) 180 MG (400 UNITS) capsule Take 400 Units by mouth daily.  . [DISCONTINUED] azithromycin (ZITHROMAX) 250  MG tablet 2 today then 1 a day for 4 days  . [DISCONTINUED] predniSONE (DELTASONE) 10 MG tablet Take 1 tablet (10 mg total) by mouth daily with breakfast.   Facility-Administered Encounter Medications as of 06/03/2020  Medication  . ipratropium-albuterol (DUONEB) 0.5-2.5 (3) MG/3ML nebulizer solution 3 mL    Allergies (verified) Amoxicillin-pot clavulanate and Penicillins   History: Past Medical History:  Diagnosis Date  . COPD (chronic obstructive pulmonary disease) (HCC)   . Depression    Past Surgical History:  Procedure Laterality Date  . TONSILLECTOMY     History reviewed. No pertinent family history. Social History   Socioeconomic History  . Marital status: Widowed    Spouse name: Not on file  . Number of children: Not on file  . Years of education: Not on file  . Highest education level: Not on file  Occupational History  . Not on file  Tobacco Use  . Smoking status: Former 06/05/2020  . Smokeless tobacco: Never Used  Vaping Use  . Vaping Use: Never used  Substance and Sexual Activity  . Alcohol use: No    Alcohol/week: 0.0 standard drinks  . Drug use: No  . Sexual activity: Not on file  Other Topics Concern  . Not on file  Social History Narrative   Pt lives with her daughter and has a son in Games developer      Has 2 sons that have passed away   Social Determinants of Health   Financial Resource Strain: Low Risk   .  Difficulty of Paying Living Expenses: Not hard at all  Food Insecurity: No Food Insecurity  . Worried About Programme researcher, broadcasting/film/video in the Last Year: Never true  . Ran Out of Food in the Last Year: Never true  Transportation Needs: No Transportation Needs  . Lack of Transportation (Medical): No  . Lack of Transportation (Non-Medical): No  Physical Activity: Insufficiently Active  . Days of Exercise per Week: 7 days  . Minutes of Exercise per Session: 10 min  Stress: No Stress Concern Present  . Feeling of Stress : Only a little  Social Connections:  Socially Isolated  . Frequency of Communication with Friends and Family: More than three times a week  . Frequency of Social Gatherings with Friends and Family: Twice a week  . Attends Religious Services: Never  . Active Member of Clubs or Organizations: No  . Attends Banker Meetings: Never  . Marital Status: Widowed    Tobacco Counseling Counseling given: Not Answered   Clinical Intake:  Pre-visit preparation completed: Yes  Pain : No/denies pain     BMI - recorded: 20.3 Nutritional Status: BMI of 19-24  Normal Nutritional Risks: None Diabetes: No  How often do you need to have someone help you when you read instructions, pamphlets, or other written materials from your doctor or pharmacy?: 1 - Never    Interpreter Needed?: No  Information entered by :: Reather Littler LPN   Activities of Daily Living In your present state of health, do you have any difficulty performing the following activities: 06/03/2020  Hearing? Y  Comment declines hearing aids  Vision? N  Difficulty concentrating or making decisions? N  Walking or climbing stairs? N  Dressing or bathing? N  Doing errands, shopping? N  Preparing Food and eating ? N  Using the Toilet? N  In the past six months, have you accidently leaked urine? Y  Comment wears pads/depends for protection  Do you have problems with loss of bowel control? N  Managing your Medications? N  Managing your Finances? N  Housekeeping or managing your Housekeeping? N  Some recent data might be hidden    Patient Care Team: Marie Limerick, MD as PCP - General (Family Medicine) Marie Moores, MD as Referring Physician (Specialist)  Indicate any recent Medical Services you may have received from other than [facility] providers in the past year (date may be approximate).     Assessment:   This is a routine wellness examination for Marie Martin.  Hearing/Vision screen  Hearing Screening   125Hz  250Hz  500Hz  1000Hz  2000Hz   3000Hz  4000Hz  6000Hz  8000Hz   Right ear:           Left ear:           Comments: Pt states mild hearing difficulty at times with certain tones   Vision Screening Comments: Annual vision screenings with Dr. in [facility]  Dietary issues and exercise activities discussed: Current Exercise Habits: Home exercise routine, Type of exercise: walking, Time (Minutes): 10, Frequency (Times/Week): 7, Weekly Exercise (Minutes/Week): 70, Intensity: Mild, Exercise limited by: respiratory conditions(s)  Goals    . DIET - INCREASE WATER INTAKE     Recommend drinking 6-8 glasses of water per day     . Patient Stated     Pt would like to be able to travel to more      Depression Screen PHQ 2/9 Scores 06/03/2020 01/30/2020 09/11/2019 07/27/2019 07/11/2019 05/24/2019 02/14/2019  PHQ - 2 Score 2 0 0 0  0 2 2  PHQ- 9 Score 6 0 0 0 0 3 2    Fall Risk Fall Risk  06/03/2020 09/11/2019 07/27/2019 07/11/2019 05/24/2019  Falls in the past year? 0 0 0 0 0  Number falls in past yr: 0 - - - 0  Injury with Fall? 0 - - - 0  Risk for fall due to : No Fall Risks No Fall Risks - - No Fall Risks  Follow up Falls prevention discussed Falls evaluation completed Falls evaluation completed Falls evaluation completed Falls prevention discussed    FALL RISK PREVENTION PERTAINING TO THE HOME:  Any stairs in or around the home? Yes  If so, are there any without handrails? No  Home free of loose throw rugs in walkways, pet beds, electrical cords, etc? Yes  Adequate lighting in your home to reduce risk of falls? Yes   ASSISTIVE DEVICES UTILIZED TO PREVENT FALLS:  Life alert? No  Use of a cane, walker or w/c? No  Grab bars in the bathroom? No  Shower chair or bench in shower? Yes  Elevated toilet seat or a handicapped toilet? No   TIMED UP AND GO:  Was the test performed? Yes .  Length of time to ambulate 10 feet: 5 sec.   Gait steady and fast without use of assistive device  Cognitive Function: Normal cognitive  status assessed by direct observation by this Nurse Health Advisor. No abnormalities found.          Immunizations Immunization History  Administered Date(s) Administered  . Fluad Quad(high Dose 65+) 10/03/2018, 12/06/2019  . Influenza, High Dose Seasonal PF 10/21/2017  . Influenza,inj,Quad PF,6+ Mos 01/14/2015, 12/02/2015, 10/01/2016  . Influenza-Unspecified 11/14/2013, 01/14/2015, 12/02/2015, 10/01/2016  . PFIZER(Purple Top)SARS-COV-2 Vaccination 04/19/2019, 05/17/2019, 11/18/2019  . Pneumococcal Conjugate-13 10/01/2016  . Pneumococcal Polysaccharide-23 08/02/2012  . Zoster 07/22/2010    TDAP status: Due, Education has been provided regarding the importance of this vaccine. Advised may receive this vaccine at local pharmacy or Health Dept. Aware to provide a copy of the vaccination record if obtained from local pharmacy or Health Dept. Verbalized acceptance and understanding.  Flu Vaccine status: Up to date  Pneumococcal vaccine status: Up to date  Covid-19 vaccine status: Completed vaccines  Qualifies for Shingles Vaccine? Yes   Zostavax completed Yes   Shingrix Completed?: No.    Education has been provided regarding the importance of this vaccine. Patient has been advised to call insurance company to determine out of pocket expense if they have not yet received this vaccine. Advised may also receive vaccine at local pharmacy or Health Dept. Verbalized acceptance and understanding.  Screening Tests Health Maintenance  Topic Date Due  . INFLUENZA VACCINE  09/09/2020  . COVID-19 Vaccine  Completed  . PNA vac Low Risk Adult  Completed  . HPV VACCINES  Aged Out  . DEXA SCAN  Discontinued  . TETANUS/TDAP  Discontinued    Health Maintenance  There are no preventive care reminders to display for this patient.  Colorectal cancer screening: No longer required.   Mammogram status: No longer required due to age.  Bone density screening: no longer required   Lung Cancer  Screening: (Low Dose CT Chest recommended if Age 70-80 years, 30 pack-year currently smoking OR have quit w/in 15years.) does not qualify.   Additional Screening:  Hepatitis C Screening: does not qualify.   Vision Screening: Recommended annual ophthalmology exams for early detection of glaucoma and other disorders of the eye. Is the patient up  to date with their annual eye exam?  Yes  Who is the provider or what is the name of the office in which the patient attends annual eye exams? Dr. Gaynell FaceMarshall  Dental Screening: Recommended annual dental exams for proper oral hygiene  Community Resource Referral / Chronic Care Management: CRR required this visit?  No   CCM required this visit?  No      Plan:     I have personally reviewed and noted the following in the patient's chart:   . Medical and social history . Use of alcohol, tobacco or illicit drugs  . Current medications and supplements . Functional ability and status . Nutritional status . Physical activity . Advanced directives . List of other physicians . Hospitalizations, surgeries, and ER visits in previous 12 months . Vitals . Screenings to include cognitive, depression, and falls . Referrals and appointments  In addition, I have reviewed and discussed with patient certain preventive protocols, quality metrics, and best practice recommendations. A written personalized care plan for preventive services as well as general preventive health recommendations were provided to patient.     Reather LittlerKasey Joshu Furukawa, LPN   1/61/09604/25/2022   Nurse Notes: pt [facility] [place]. Last filled 05/09/20.

## 2020-06-11 DIAGNOSIS — J449 Chronic obstructive pulmonary disease, unspecified: Secondary | ICD-10-CM | POA: Diagnosis not present

## 2020-06-11 DIAGNOSIS — R059 Cough, unspecified: Secondary | ICD-10-CM | POA: Diagnosis not present

## 2020-06-11 DIAGNOSIS — Z9981 Dependence on supplemental oxygen: Secondary | ICD-10-CM | POA: Diagnosis not present

## 2020-06-11 DIAGNOSIS — R0600 Dyspnea, unspecified: Secondary | ICD-10-CM | POA: Diagnosis not present

## 2020-06-13 ENCOUNTER — Telehealth: Payer: Self-pay

## 2020-06-13 NOTE — Telephone Encounter (Signed)
Called pt back- advised to get 2nd booster

## 2020-06-13 NOTE — Telephone Encounter (Unsigned)
Copied from CRM [id number]. Topic: General - Other >> Jun 13, 2020  9:53 AM Gwenlyn Fudge wrote: Reason for CRM: Pt calling and is requesting to speak with Delice Bison regarding the 2nd covid booster. Please advise.

## 2020-07-04 ENCOUNTER — Other Ambulatory Visit: Payer: Self-pay

## 2020-07-04 ENCOUNTER — Encounter: Payer: Self-pay | Admitting: Family Medicine

## 2020-07-04 ENCOUNTER — Ambulatory Visit (INDEPENDENT_AMBULATORY_CARE_PROVIDER_SITE_OTHER): Payer: Medicare Other | Admitting: Family Medicine

## 2020-07-04 VITALS — BP 104/68 | HR 86 | Temp 98.2°F | Ht 66.0 in | Wt 126.0 lb

## 2020-07-04 DIAGNOSIS — R059 Cough, unspecified: Secondary | ICD-10-CM | POA: Diagnosis not present

## 2020-07-04 DIAGNOSIS — J441 Chronic obstructive pulmonary disease with (acute) exacerbation: Secondary | ICD-10-CM | POA: Diagnosis not present

## 2020-07-04 MED ORDER — PREDNISONE 10 MG PO TABS
10.0000 mg | ORAL_TABLET | Freq: Every day | ORAL | 0 refills | Status: DC
Start: 2020-07-04 — End: 2020-12-13

## 2020-07-04 MED ORDER — AZITHROMYCIN 250 MG PO TABS: ORAL_TABLET | ORAL | 0 refills | Status: AC

## 2020-07-04 NOTE — Progress Notes (Signed)
Date:  07/04/2020   Name:  Marie Martin   DOB:  Jun 27, 1931   MRN:  [id number]   Chief Complaint: chest congestion (X3-4 days, getting worse, breathing problems, yellow mucous)  Shortness of Breath This is a chronic problem. The current episode started today. The problem occurs constantly. The problem has been gradually worsening. Associated symptoms include sputum production and wheezing. Pertinent negatives include no abdominal pain, chest pain, claudication, coryza, ear pain, fever, headaches, hemoptysis, leg pain, leg swelling, neck pain, orthopnea, PND, rash, rhinorrhea, sore throat, swollen glands, syncope or vomiting. The symptoms are aggravated by weather changes. She has tried steroid inhalers and beta agonist inhalers for the symptoms. The treatment provided no relief. Her past medical history is significant for COPD.    Lab Results  Component Value Date   CREATININE 1.27 (H) 07/11/2019   BUN 22 07/11/2019   NA 142 07/11/2019   K 4.8 07/11/2019   CL 103 07/11/2019   CO2 23 07/11/2019   Lab Results  Component Value Date   CHOL 255 (H) 07/11/2019   HDL 61 07/11/2019   LDLCALC 169 (H) 07/11/2019   TRIG 141 07/11/2019   CHOLHDL 3.7 12/02/2015   No results found for: TSH No results found for: HGBA1C No results found for: WBC, HGB, HCT, MCV, PLT No results found for: ALT, AST, GGT, ALKPHOS, BILITOT   Review of Systems  Constitutional: Negative for chills and fever.  HENT: Negative for drooling, ear discharge, ear pain, rhinorrhea and sore throat.   Respiratory: Positive for sputum production, shortness of breath and wheezing. Negative for cough and hemoptysis.   Cardiovascular: Negative for chest pain, palpitations, orthopnea, claudication, leg swelling, syncope and PND.  Gastrointestinal: Negative for abdominal pain, blood in stool, constipation, diarrhea, nausea and vomiting.  Endocrine: Negative for polydipsia.  Genitourinary: Negative for dysuria, frequency,  hematuria and urgency.  Musculoskeletal: Negative for back pain, myalgias and neck pain.  Skin: Negative for rash.  Allergic/Immunologic: Negative for environmental allergies.  Neurological: Negative for dizziness and headaches.  Hematological: Does not bruise/bleed easily.  Psychiatric/Behavioral: Negative for suicidal ideas. The patient is not nervous/anxious.     Patient Active Problem List   Diagnosis Date Noted  . COPD exacerbation (HCC) 02/22/2017  . Cough 02/22/2017  . Depression 02/22/2017  . SOB (shortness of breath) 02/22/2017  . 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 Recurrent major depressive episodes (HCC) 05/31/2014    Allergies  Allergen Reactions  . Amoxicillin-Pot Clavulanate Nausea Only and Other (See Comments)    Other Reaction: trouble swallowing  . Penicillins     Past Surgical History:  Procedure Laterality Date  . TONSILLECTOMY      Social History   Tobacco Use  . Smoking status: Former Games developer  . Smokeless tobacco: Never Used  Vaping Use  . Vaping Use: Never used  Substance Use Topics  . Alcohol use: No    Alcohol/week: 0.0 standard drinks  . Drug use: No     Medication list has been reviewed and updated.  Current Meds  Medication Sig  . albuterol (PROVENTIL) (2.5 MG/3ML) 0.083% nebulizer solution USE 1 VIAL IN NEBULIZER EVERY 6 HOURS AS NEEDED FOR WHEEZING FOR SHORTNESS OF BREATH  . albuterol (VENTOLIN HFA) 108 (90 Base) MCG/ACT inhaler Inhale 2 puffs into the lungs every 6 (six) hours as needed for wheezing or shortness of breath.  . Ascorbic Acid (VITAMIN C WITH ROSE HIPS) 1000 MG tablet Take 1,000 mg by mouth daily.  . Fluticasone-Umeclidin-Vilant (  TRELEGY ELLIPTA) 100-62.5-25 MCG/INH AEPB Inhale 1 puff into the lungs daily.  . Garlic 100 MG TABS Take by mouth.  . Multiple Vitamins-Minerals (MULTIVITAMIN WOMENS 50+ ADV) TABS Take by mouth.  . sertraline (ZOLOFT) 100 MG tablet TAKE 1 TABLET BY MOUTH DAILY. GENERIC EQUIVALENT FOR  ZOLOFT  . vitamin B-12 (CYANOCOBALAMIN) 1000 MCG tablet Take 1,000 mcg by mouth daily.  . vitamin E 180 MG (400 UNITS) capsule Take 400 Units by mouth daily.   Current Facility-Administered Medications for the 07/04/20 encounter (Office Visit) with Duanne Limerick, MD  Medication  . ipratropium-albuterol (DUONEB) 0.5-2.5 (3) MG/3ML nebulizer solution 3 mL    PHQ 2/9 Scores 07/04/2020 06/03/2020 01/30/2020 09/11/2019  PHQ - 2 Score 0 2 0 0  PHQ- 9 Score 0 6 0 0    GAD 7 : Generalized Anxiety Score 07/04/2020 01/30/2020 09/11/2019 07/27/2019  Nervous, Anxious, on Edge 0 0 0 0  Control/stop worrying 0 0 0 0  Worry too much - different things 0 0 0 0  Trouble relaxing 0 0 0 0  Restless 0 0 0 0  Easily annoyed or irritable 0 0 0 0  Afraid - awful might happen 0 0 0 0  Total GAD 7 Score 0 0 0 0  Anxiety Difficulty - - Not difficult at all -    BP Readings from Last 3 Encounters:  07/04/20 104/68  06/03/20 128/72  04/16/20 120/70    Physical Exam Vitals and nursing note reviewed.  Constitutional:      Appearance: She is well-developed.  HENT:     Head: Normocephalic.     Right Ear: Tympanic membrane, ear canal and external ear normal.     Left Ear: Tympanic membrane, ear canal and external ear normal.  Eyes:     General: Lids are everted, no foreign bodies appreciated. No scleral icterus.       Left eye: No foreign body or hordeolum.     Conjunctiva/sclera: Conjunctivae normal.     Right eye: Right conjunctiva is not injected.     Left eye: Left conjunctiva is not injected.     Pupils: Pupils are equal, round, and reactive to light.  Neck:     Thyroid: No thyromegaly.     Vascular: No JVD.     Trachea: No tracheal deviation.  Cardiovascular:     Rate and Rhythm: Normal rate and regular rhythm.     Chest Wall: PMI is not displaced.     Pulses: Normal pulses.     Heart sounds: Normal heart sounds, S1 normal and S2 normal. No murmur heard.  No systolic murmur is present.  No  diastolic murmur is present. No friction rub. No gallop. No S3 or S4 sounds.   Pulmonary:     Effort: Pulmonary effort is normal. No respiratory distress.     Breath sounds: Normal breath sounds. No wheezing or rales.  Abdominal:     General: Bowel sounds are normal.     Palpations: Abdomen is soft. There is no mass.     Tenderness: There is no abdominal tenderness. There is no guarding or rebound.  Musculoskeletal:        General: No tenderness. Normal range of motion.     Cervical back: Normal range of motion and neck supple.  Lymphadenopathy:     Cervical: No cervical adenopathy.  Skin:    General: Skin is warm.     Findings: No rash.  Neurological:     Mental Status: She  is alert and oriented to person, place, and time.     Cranial Nerves: No cranial nerve deficit.     Deep Tendon Reflexes: Reflexes normal.  Psychiatric:        Mood and Affect: Mood is not anxious or depressed.     Wt Readings from Last 3 Encounters:  07/04/20 126 lb (57.2 kg)  06/03/20 125 lb 12.8 oz (57.1 kg)  04/16/20 127 lb (57.6 kg)    BP 104/68   Pulse 86   Temp 98.2 F (36.8 C) (Oral)   Ht 5\' 6"  (1.676 m)   Wt 126 lb (57.2 kg)   SpO2 97%   BMI 20.34 kg/m   Assessment and Plan:  1. COPD exacerbation (HCC) Acute.  Persistent.  Underlying chronic condition.  Patient has significant COPD which is controlled on Trelegy.  Patient began having an exacerbation today with shortness of breath cough that is productive and wheezing.  Patient was given some Trelegy 200 mg to take during the exacerbation and to take with her to to extend to she can return to [place].  Patient was started on azithromycin 2 tablets today followed by 1 a day for 4 days and prednisone 10 mg once a day.  Patient is controlling her cough with Mucinex. - predniSONE (DELTASONE) 10 MG tablet; Take 1 tablet (10 mg total) by mouth daily with breakfast.  Dispense: 30 tablet; Refill: 0 - azithromycin (ZITHROMAX) 250 MG  tablet; Take 2 tablets on day 1, then 1 tablet daily on days 2 through 5  Dispense: 6 tablet; Refill: 0 New onset.  Worsening. 2. Cough New onset.  Worsening.  Stable.  Patient has a productive cough secondary to the acute exacerbation of her baseline COPD and likely bronchiolitis.  Above treatment noted and patient has been given a Trelegy 200 mg to use during the next 2 to 3 days of worsening.

## 2020-07-10 ENCOUNTER — Ambulatory Visit: Payer: Self-pay | Admitting: Family Medicine

## 2020-07-22 ENCOUNTER — Other Ambulatory Visit: Payer: Self-pay | Admitting: Family Medicine

## 2020-07-22 DIAGNOSIS — J441 Chronic obstructive pulmonary disease with (acute) exacerbation: Secondary | ICD-10-CM

## 2020-07-22 NOTE — Telephone Encounter (Signed)
   Notes to clinic:script has expired Review for continued use and refill    Requested Prescriptions  Pending Prescriptions Disp Refills   albuterol (VENTOLIN HFA) 108 (90 Base) MCG/ACT inhaler [Pharmacy Med Name: Albuterol Sulfate HFA 108 (90 Base) MCG/ACT Inhalation Aerosol Solution] 18 g 0    Sig: INHALE 2 PUFFS BY MOUTH EVERY 6 HOURS AS NEEDED FOR WHEEZING FOR SHORTNESS OF BREATH      Pulmonology:  Beta Agonists Failed - 07/22/2020  9:29 AM      Failed - One inhaler should last at least one month. If the patient is requesting refills earlier, contact the patient to check for uncontrolled symptoms.      Passed - Valid encounter within last 12 months    Recent Outpatient Visits           2 weeks ago COPD exacerbation (HCC)   [facility] Duanne Limerick, MD   3 months ago COPD (chronic obstructive pulmonary disease) with acute bronchitis (HCC)   [facility] Duanne Limerick, MD   5 months ago Acute maxillary sinusitis, recurrence not specified   [facility] Duanne Limerick, MD   7 months ago COPD (chronic obstructive pulmonary disease) with acute bronchitis (HCC)   [facility] Duanne Limerick, MD   8 months ago COPD (chronic obstructive pulmonary disease) with acute bronchitis [facility])   [facility] Duanne Limerick, MD

## 2020-10-17 ENCOUNTER — Other Ambulatory Visit: Payer: Self-pay | Admitting: Family Medicine

## 2020-10-17 DIAGNOSIS — J441 Chronic obstructive pulmonary disease with (acute) exacerbation: Secondary | ICD-10-CM

## 2020-10-17 NOTE — Telephone Encounter (Signed)
Medication Refill - Medication: Trelegy   Has the patient contacted their pharmacy? No. Pt states that she would like to have this at the pharmacy when she gets back out of town. She is requesting to have Delice Bison give her a call back. Please advise.  (Agent: If no, request that the patient contact the pharmacy for the refill.) (Agent: If yes, when and what did the pharmacy advise?)  Preferred Pharmacy (with phone number or street name):  [facility] [address], [place] - [address]  [address] [place] [address]  Phone: [phone] Fax: [phone]  Hours: Not open 24 hours   Agent: Please be advised that RX refills may take up to 3 business days. We ask that you follow-up with your pharmacy.

## 2020-10-18 NOTE — Telephone Encounter (Signed)
  Requested medications are on the active medication list yes  Last visit 07/04/20  Future visit scheduled 12/12/20  Notes to clinic No protocol for this med, please assess.

## 2020-10-21 ENCOUNTER — Telehealth: Payer: Self-pay | Admitting: Family Medicine

## 2020-10-21 ENCOUNTER — Telehealth: Payer: Self-pay

## 2020-10-21 ENCOUNTER — Other Ambulatory Visit: Payer: Self-pay

## 2020-10-21 DIAGNOSIS — F339 Major depressive disorder, recurrent, unspecified: Secondary | ICD-10-CM

## 2020-10-21 MED ORDER — SERTRALINE HCL 100 MG PO TABS: ORAL_TABLET | ORAL | 0 refills | Status: DC

## 2020-10-21 NOTE — Telephone Encounter (Signed)
Pt is calling to ask Delice Bison to call her back regarding sertraline (ZOLOFT) 100 MG tablet [[id number]] refill request. Request was sent to [facility].

## 2020-10-21 NOTE — Telephone Encounter (Signed)
Medication: sertraline (ZOLOFT) 100 MG tablet [[id number]]  Has the patient contacted their pharmacy? YES (Agent: If no, request that the patient contact the pharmacy for the refill.) (Agent: If yes, when and what did the pharmacy advise?)  Preferred Pharmacy (with phone number or street name): [facility] - [place], [place] - [address] [place] [address] Phone: [phone] Fax: [phone] Hours: Not open 24 hours    Agent: Please be advised that RX refills may take up to 3 business days. We ask that you follow-up with your pharmacy.

## 2020-10-21 NOTE — Telephone Encounter (Signed)
Copied from CRM [id number]. Topic: General - Call Back - No Documentation >> Oct 21, 2020  1:55 PM Randol Kern wrote: Reason for CRM: Pt called and wants to speak to Delice Bison regarding a personal question Best contact: [phone]

## 2020-10-21 NOTE — Telephone Encounter (Signed)
[facility] called and spoke to [place], Pharmacy Help Desk about the patient's request to send Zoloft to their pharmacy. She says the Rx was sent to [facility] and it's ok to have it refilled there. She says [facility] is the insurance plan that they have several mail order pharmacies, such as [facility], but it's ok for the patient to use [facility]. I called the patient, left VM to pick up Rx at [facility], advised to call the office if have questions.

## 2020-10-29 ENCOUNTER — Ambulatory Visit (INDEPENDENT_AMBULATORY_CARE_PROVIDER_SITE_OTHER): Payer: Medicare Other | Admitting: Family Medicine

## 2020-10-29 ENCOUNTER — Other Ambulatory Visit: Payer: Self-pay

## 2020-10-29 ENCOUNTER — Encounter: Payer: Self-pay | Admitting: Family Medicine

## 2020-10-29 VITALS — BP 130/80 | HR 68 | Ht 66.0 in | Wt 130.0 lb

## 2020-10-29 DIAGNOSIS — Z23 Encounter for immunization: Secondary | ICD-10-CM

## 2020-10-29 DIAGNOSIS — J441 Chronic obstructive pulmonary disease with (acute) exacerbation: Secondary | ICD-10-CM | POA: Diagnosis not present

## 2020-10-29 DIAGNOSIS — F339 Major depressive disorder, recurrent, unspecified: Secondary | ICD-10-CM

## 2020-10-29 DIAGNOSIS — J440 Chronic obstructive pulmonary disease with acute lower respiratory infection: Secondary | ICD-10-CM | POA: Diagnosis not present

## 2020-10-29 DIAGNOSIS — J209 Acute bronchitis, unspecified: Secondary | ICD-10-CM

## 2020-10-29 MED ORDER — AZITHROMYCIN 250 MG PO TABS: ORAL_TABLET | ORAL | 0 refills | Status: AC

## 2020-10-29 NOTE — Progress Notes (Signed)
Date:  10/29/2020   Name:  Marie Martin   DOB:  Jun 18, 1931   MRN:  [id number]   Chief Complaint: consultation for mental (Evaluation for mental status) and Flu Vaccine  Shortness of Breath This is a chronic problem. The current episode started more than 1 year ago. The problem has been gradually improving. Pertinent negatives include no abdominal pain, chest pain, claudication, coryza, ear pain, fever, headaches, hemoptysis, leg pain, leg swelling, neck pain, orthopnea, PND, rash, rhinorrhea, sore throat, sputum production, swollen glands, syncope, vomiting or wheezing. Nothing aggravates the symptoms. She has tried beta agonist inhalers, ipratropium inhalers and steroid inhalers for the symptoms. The treatment provided moderate relief. There is no history of allergies, aspirin allergies, asthma, bronchiolitis, CAD, chronic lung disease, COPD, DVT, a heart failure, PE, pneumonia or a recent surgery.   Lab Results  Component Value Date   CREATININE 1.27 (H) 07/11/2019   BUN 22 07/11/2019   NA 142 07/11/2019   K 4.8 07/11/2019   CL 103 07/11/2019   CO2 23 07/11/2019   Lab Results  Component Value Date   CHOL 255 (H) 07/11/2019   HDL 61 07/11/2019   LDLCALC 169 (H) 07/11/2019   TRIG 141 07/11/2019   CHOLHDL 3.7 12/02/2015   No results found for: TSH No results found for: HGBA1C No results found for: WBC, HGB, HCT, MCV, PLT No results found for: ALT, AST, GGT, ALKPHOS, BILITOT   Review of Systems  Constitutional:  Negative for fever.  HENT:  Negative for ear pain, rhinorrhea and sore throat.   Respiratory:  Positive for shortness of breath. Negative for hemoptysis, sputum production and wheezing.   Cardiovascular:  Negative for chest pain, orthopnea, claudication, leg swelling, syncope and PND.  Gastrointestinal:  Negative for abdominal pain and vomiting.  Musculoskeletal:  Negative for neck pain.  Skin:  Negative for rash.  Neurological:  Negative for headaches.    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  Current Meds  Medication Sig   albuterol (PROVENTIL) (2.5 MG/3ML) 0.083% nebulizer solution USE 1 VIAL IN NEBULIZER EVERY 6 HOURS AS NEEDED FOR WHEEZING FOR SHORTNESS OF BREATH   albuterol (VENTOLIN HFA) 108 (90 Base) MCG/ACT inhaler INHALE 2 PUFFS BY MOUTH EVERY 6 HOURS AS NEEDED FOR WHEEZING FOR SHORTNESS OF BREATH   Ascorbic Acid (VITAMIN C WITH ROSE HIPS) 1000 MG tablet Take 1,000 mg by mouth daily.   Fluticasone-Umeclidin-Vilant (TRELEGY ELLIPTA) 100-62.5-25 MCG/INH AEPB Inhale 1 puff into the lungs daily.   Garlic 100 MG TABS Take by mouth.   Multiple Vitamins-Minerals (MULTIVITAMIN WOMENS 50+ ADV) TABS Take by mouth.   sertraline (ZOLOFT) 100 MG tablet TAKE 1 TABLET BY  MOUTH DAILY. GENERIC EQUIVALENT FOR ZOLOFT   vitamin B-12 (CYANOCOBALAMIN) 1000 MCG tablet Take 1,000 mcg by mouth daily.   vitamin E 180 MG (400 UNITS) capsule Take 400 Units by mouth daily.   Current Facility-Administered Medications for the 10/29/20 encounter (Office Visit) with Duanne Limerick, MD  Medication   ipratropium-albuterol (DUONEB) 0.5-2.5 (3) MG/3ML nebulizer solution 3 mL    PHQ 2/9 Scores 10/29/2020 07/04/2020 06/03/2020 01/30/2020   PHQ - 2 Score 0 0 2 0  PHQ- 9 Score 0 0 6 0    GAD 7 : Generalized Anxiety Score 10/29/2020 07/04/2020 01/30/2020 09/11/2019  Nervous, Anxious, on Edge 0 0 0 0  Control/stop worrying 0 0 0 0  Worry too much - different things 0 0 0 0  Trouble relaxing 0 0 0 0  Restless 0 0 0 0  Easily annoyed or irritable 0 0 0 0  Afraid - awful might happen 0 0 0 0  Total GAD 7 Score 0 0 0 0  Anxiety Difficulty - - - Not difficult at all    BP Readings from Last 3 Encounters:  10/29/20 130/80  07/04/20 104/68  06/03/20 128/72    Physical Exam Vitals and nursing note reviewed.  Constitutional:      Appearance: She is well-developed.  HENT:     Head: Normocephalic.     Right Ear: Tympanic membrane and external ear normal.     Left Ear: Tympanic membrane and external ear normal.     Mouth/Throat:     Mouth: Mucous membranes are moist.  Eyes:     General: Lids are everted, no foreign bodies appreciated. No scleral icterus.       Left eye: No foreign body or hordeolum.     Conjunctiva/sclera: Conjunctivae normal.     Right eye: Right conjunctiva is not injected.     Left eye: Left conjunctiva is not injected.     Pupils: Pupils are equal, round, and reactive to light.  Neck:     Thyroid: No thyromegaly.     Vascular: No JVD.     Trachea: No tracheal deviation.  Cardiovascular:     Rate and Rhythm: Normal rate and regular rhythm.     Heart sounds: Normal heart sounds. No murmur heard.   No friction rub. No gallop.  Pulmonary:     Effort: Pulmonary effort is normal. No respiratory distress.     Breath sounds: Normal breath sounds. No decreased breath sounds, wheezing, rhonchi or rales.  Abdominal:     General: Bowel sounds are normal.     Palpations: Abdomen is soft. There is no mass.     Tenderness: There is no abdominal tenderness. There is no guarding or rebound.  Musculoskeletal:        General: No tenderness. Normal range of motion.     Cervical back: Normal range of motion and  neck supple.  Lymphadenopathy:     Cervical: No cervical adenopathy.  Skin:    General: Skin is warm.     Findings: No rash.  Neurological:     Mental Status: She is alert and oriented to person, place, and time.     Cranial Nerves: No cranial nerve deficit.     Deep Tendon Reflexes: Reflexes normal.  Psychiatric:        Mood and Affect: Mood is not anxious or depressed.    Wt Readings from Last 3 Encounters:  10/29/20 130 lb (59 kg)  07/04/20 126 lb (57.2 kg)  06/03/20 125 lb 12.8 oz (57.1 kg)    BP 130/80   Pulse 68   Ht 5\' 6"  (1.676 m)   Wt 130 lb (59 kg)   BMI 20.98 kg/m   Assessment and Plan:  1. Recurrent major depressive episodes (HCC)  Chronic.  Controlled.  Stable.  PHQ is 0.  Continue sertraline 50 mg once a day. 2. COPD (chronic obstructive pulmonary disease) with acute bronchitis (HCC) Chronic.  Controlled.  Stable.  Patient is doing better on Trelegy 201 100 and would like to be opted on the dosing and we will bring that to the attention of Dr. .  3. COPD exacerbation (HCC) New onset.  With exacerbation.  We will initiate a Zithromax 250 mg 2 today followed by 1 a day for 4 days. - azithromycin (ZITHROMAX) 250 MG tablet; Take 2 tablets on day 1, then 1 tablet daily on days 2 through 5  Dispense: 6 tablet; Refill: 0  4. Need for immunization against influenza Discussed and administered. - Flu Vaccine QUAD High Dose(Fluad)

## 2020-11-20 ENCOUNTER — Telehealth: Payer: Self-pay

## 2020-11-20 NOTE — Telephone Encounter (Signed)
Copied from CRM [id number]. Topic: General - Inquiry >> Nov 20, 2020 12:13 PM Traci Sermon wrote: Reason for CRM: PT called in wanting Delice Bison to give her a call, she did not want to say about what, please advise.

## 2020-11-22 NOTE — Telephone Encounter (Signed)
Pt states she will address her question when she comes in for appt.

## 2020-12-02 ENCOUNTER — Ambulatory Visit: Payer: Self-pay

## 2020-12-02 ENCOUNTER — Telehealth: Payer: Self-pay

## 2020-12-02 NOTE — Telephone Encounter (Signed)
Returned pt's call.   Pt would like to know it is ok for her to get her final COVID booster.   When pt got her Flu vaccine she was told to wait "awhile" before getting her COVID booster.   Pt got flu vaccine September 2022. And her last COVID booster  before then.  Pt would like to know if enough time has past for her to get the COVID booster.

## 2020-12-12 ENCOUNTER — Ambulatory Visit: Payer: Self-pay

## 2020-12-12 NOTE — Telephone Encounter (Signed)
Pt. States she started coughing Monday.Productive with white mucus. No fever. O2 saturation 96% yesterday. States has COPD "and when I have a flare up a Z-pack helps me." Has appointment tomorrow, but is asking for an antibiotic to be sent to her pharmacy. Please advise pt.      Answer Assessment - Initial Assessment Questions 1. ONSET: "When did the cough begin?"      Monday 2. SEVERITY: "How bad is the cough today?"      Moderate 3. SPUTUM: "Describe the color of your sputum" (none, dry cough; clear, white, yellow, green)     White 4. HEMOPTYSIS: "Are you coughing up any blood?" If so ask: "How much?" (flecks, streaks, tablespoons, etc.)     No 5. DIFFICULTY BREATHING: "Are you having difficulty breathing?" If Yes, ask: "How bad is it?" (e.g., mild, moderate, severe)    - MILD: No SOB at rest, mild SOB with walking, speaks normally in sentences, can lie down, no retractions, pulse < 100.    - MODERATE: SOB at rest, SOB with minimal exertion and prefers to sit, cannot lie down flat, speaks in phrases, mild retractions, audible wheezing, pulse 100-120.    - SEVERE: Very SOB at rest, speaks in single words, struggling to breathe, sitting hunched forward, retractions, pulse > 120      Mild 6. FEVER: "Do you have a fever?" If Yes, ask: "What is your temperature, how was it measured, and when did it start?"     No 7. CARDIAC HISTORY: "Do you have any history of heart disease?" (e.g., heart attack, congestive heart failure)      No 8. LUNG HISTORY: "Do you have any history of lung disease?"  (e.g., pulmonary embolus, asthma, emphysema)     COPD 9. PE RISK FACTORS: "Do you have a history of blood clots?" (or: recent major surgery, recent prolonged travel, bedridden)     No 10. OTHER SYMPTOMS: "Do you have any other symptoms?" (e.g., runny nose, wheezing, chest pain)       Wheezing 11. PREGNANCY: "Is there any chance you are pregnant?" "When was your last menstrual period?"       No 12.  TRAVEL: "Have you traveled out of the country in the last month?" (e.g., travel history, exposures)       No  Protocols used: Cough - Chronic-A-AH

## 2020-12-13 ENCOUNTER — Other Ambulatory Visit: Payer: Self-pay

## 2020-12-13 ENCOUNTER — Ambulatory Visit (INDEPENDENT_AMBULATORY_CARE_PROVIDER_SITE_OTHER): Payer: Medicare Other | Admitting: Family Medicine

## 2020-12-13 ENCOUNTER — Encounter: Payer: Self-pay | Admitting: Family Medicine

## 2020-12-13 VITALS — BP 128/78 | HR 78 | Temp 98.4°F | Ht 66.0 in | Wt 129.6 lb

## 2020-12-13 DIAGNOSIS — R051 Acute cough: Secondary | ICD-10-CM | POA: Diagnosis not present

## 2020-12-13 DIAGNOSIS — F339 Major depressive disorder, recurrent, unspecified: Secondary | ICD-10-CM

## 2020-12-13 DIAGNOSIS — J441 Chronic obstructive pulmonary disease with (acute) exacerbation: Secondary | ICD-10-CM | POA: Diagnosis not present

## 2020-12-13 MED ORDER — ALBUTEROL SULFATE HFA 108 (90 BASE) MCG/ACT IN AERS: INHALATION_SPRAY | RESPIRATORY_TRACT | 1 refills | Status: DC

## 2020-12-13 MED ORDER — AZITHROMYCIN 250 MG PO TABS: ORAL_TABLET | ORAL | 0 refills | Status: AC

## 2020-12-13 MED ORDER — PREDNISONE 10 MG PO TABS: ORAL_TABLET | ORAL | 0 refills | Status: DC

## 2020-12-13 MED ORDER — GUAIFENESIN-CODEINE 100-10 MG/5ML PO SYRP: 5.0000 mL | ORAL_SOLUTION | Freq: Three times a day (TID) | ORAL | 0 refills | Status: DC | PRN

## 2020-12-13 MED ORDER — SERTRALINE HCL 100 MG PO TABS
ORAL_TABLET | ORAL | 1 refills | Status: DC
Start: 2020-12-13 — End: 2021-10-22

## 2020-12-13 NOTE — Progress Notes (Signed)
Date:  12/13/2020   Name:  Marie Martin   DOB:  1931-02-23   MRN:  [id number]   Chief Complaint: No chief complaint on file.  Cough This is a new problem. The current episode started yesterday. The problem has been waxing and waning. The problem occurs hourly. The cough is Non-productive. Associated symptoms include ear congestion. Pertinent negatives include no ear pain, fever, nasal congestion or postnasal drip. The symptoms are aggravated by lying down.   Lab Results  Component Value Date   CREATININE 1.27 (H) 07/11/2019   BUN 22 07/11/2019   NA 142 07/11/2019   K 4.8 07/11/2019   CL 103 07/11/2019   CO2 23 07/11/2019   Lab Results  Component Value Date   CHOL 255 (H) 07/11/2019   HDL 61 07/11/2019   LDLCALC 169 (H) 07/11/2019   TRIG 141 07/11/2019   CHOLHDL 3.7 12/02/2015   No results found for: TSH No results found for: HGBA1C No results found for: WBC, HGB, HCT, MCV, PLT No results found for: ALT, AST, GGT, ALKPHOS, BILITOT   Review of Systems  Constitutional:  Negative for fever.  HENT:  Negative for ear pain and postnasal drip.   Respiratory:  Positive for cough.    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  No outpatient  medications have been marked as taking for the 12/13/20 encounter (Appointment) with Duanne Limerick, MD.   Current Facility-Administered Medications for the 12/13/20 encounter (Appointment) with Duanne Limerick, MD  Medication   ipratropium-albuterol (DUONEB) 0.5-2.5 (3) MG/3ML nebulizer solution 3 mL    PHQ 2/9 Scores 10/29/2020 07/04/2020 06/03/2020 01/30/2020  PHQ - 2 Score 0 0 2 0  PHQ- 9 Score 0 0 6 0    GAD 7 : Generalized Anxiety Score 10/29/2020 07/04/2020 01/30/2020 09/11/2019  Nervous, Anxious, on Edge 0 0 0 0  Control/stop worrying 0 0 0 0  Worry too much - different things 0 0 0 0  Trouble relaxing 0 0 0 0  Restless 0 0 0 0  Easily annoyed or irritable 0 0 0 0  Afraid - awful might happen 0 0 0 0  Total GAD 7 Score 0 0 0 0  Anxiety Difficulty - - - Not difficult at all    BP Readings from Last 3 Encounters:  10/29/20 130/80  07/04/20 104/68  06/03/20 128/72    Physical Exam  Wt Readings from Last 3 Encounters:  10/29/20 130 lb (59 kg)  07/04/20 126 lb (57.2 kg)  06/03/20 125 lb 12.8 oz (57.1 kg)    There were no vitals taken for this visit.  Assessment and Plan: 1. Chronic obstructive pulmonary disease  with acute exacerbation (HCC) Chronic.  With acute exacerbation.  Persistent.  Episodic with waxing and waning degree of COPD involvement.  Patient will continue albuterol either by nebulization or handheld inhaler.  We will also put on prednisone on a tapering [id number] and azithromycin as directed. - albuterol (VENTOLIN HFA) 108 (90 Base) MCG/ACT inhaler; INHALE 2 PUFFS BY MOUTH EVERY 6 HOURS AS NEEDED FOR WHEEZING FOR SHORTNESS OF BREATH  Dispense: 18 g; Refill: 1 - azithromycin (ZITHROMAX) 250 MG tablet; Take 2 tablets on day 1, then 1 tablet daily on days 2 through 5  Dispense: 6 tablet; Refill: 0 - predniSONE (DELTASONE) 10 MG tablet; Taper 6,6,6,5,5,5,4,4,3,3,2,2,1,1  Dispense: 53 tablet; Refill: 0  2. Recurrent major depressive episodes (HCC) Chronic.   Controlled.  Stable.  PHQ is 1 Gad score 0 continue Zoloft 100 mg daily. - sertraline (ZOLOFT) 100 MG tablet; TAKE 1 TABLET BY MOUTH DAILY. GENERIC EQUIVALENT FOR ZOLOFT  Dispense: 90 tablet; Refill: 1  3. Acute cough Patient with acute cough that is bothersome particularly at night we will cover with Robitussin-AC.  Reviewed with PMP aware is negative for excessive use of opiates. - guaiFENesin-codeine (ROBITUSSIN AC) 100-10 MG/5ML syrup; Take 5 mLs by mouth 3 (three) times daily as needed for cough.  Dispense: 118 mL; Refill: 0

## 2020-12-17 ENCOUNTER — Ambulatory Visit: Payer: Medicare Other | Admitting: Family Medicine

## 2021-01-06 ENCOUNTER — Other Ambulatory Visit: Payer: Self-pay | Admitting: Family Medicine

## 2021-01-06 DIAGNOSIS — R051 Acute cough: Secondary | ICD-10-CM

## 2021-01-06 NOTE — Telephone Encounter (Signed)
Medication Refill - Medication: guaiFENesin-codeine (ROBITUSSIN AC) 100-10 MG/5ML syrup  Has the patient contacted their pharmacy? Yes.   (Agent: If no, request that the patient contact the pharmacy for the refill. If patient does not wish to contact the pharmacy document the reason why and proceed with request.) (Agent: If yes, when and what did the pharmacy advise?)  Preferred Pharmacy (with phone number or street name): [id number] - [place], [place] - [address] Has the patient been seen for an appointment in the last year OR does the patient have an upcoming appointment? Yes.    Agent: Please be advised that RX refills may take up to 3 business days. We ask that you follow-up with your pharmacy.

## 2021-01-07 NOTE — Telephone Encounter (Signed)
Requested medications are due for refill today.  yes  Requested medications are on the active medications list.  yes  Last refill. 12/13/2020  Future visit scheduled.   With a nurse  Notes to clinic.  Medication not delegated

## 2021-01-08 ENCOUNTER — Telehealth: Payer: Self-pay

## 2021-01-08 NOTE — Telephone Encounter (Signed)
Left message to call back to speak with one of the receptionists at [facility]

## 2021-01-08 NOTE — Telephone Encounter (Signed)
Copied from CRM [id number]. Topic: General - Other >> Jan 08, 2021  9:56 AM Jaquita Rector A wrote: Reason for CRM: Patient called in asking for Delice Bison to give her a call to discuss cough medicine can be reached at Ph# [phone]

## 2021-01-14 ENCOUNTER — Encounter: Payer: Self-pay | Admitting: Family Medicine

## 2021-01-14 ENCOUNTER — Ambulatory Visit (INDEPENDENT_AMBULATORY_CARE_PROVIDER_SITE_OTHER): Payer: Medicare Other | Admitting: Family Medicine

## 2021-01-14 ENCOUNTER — Other Ambulatory Visit: Payer: Self-pay

## 2021-01-14 VITALS — BP 124/70 | HR 88 | Ht 66.0 in | Wt 130.0 lb

## 2021-01-14 DIAGNOSIS — J0100 Acute maxillary sinusitis, unspecified: Secondary | ICD-10-CM | POA: Diagnosis not present

## 2021-01-14 DIAGNOSIS — J441 Chronic obstructive pulmonary disease with (acute) exacerbation: Secondary | ICD-10-CM | POA: Diagnosis not present

## 2021-01-14 DIAGNOSIS — R051 Acute cough: Secondary | ICD-10-CM | POA: Diagnosis not present

## 2021-01-14 MED ORDER — GUAIFENESIN-DM 100-10 MG/5ML PO SYRP
5.0000 mL | ORAL_SOLUTION | ORAL | 0 refills | Status: DC | PRN
Start: 2021-01-14 — End: 2021-06-10

## 2021-01-14 MED ORDER — PREDNISONE 10 MG PO TABS: 10.0000 mg | ORAL_TABLET | Freq: Every day | ORAL | 0 refills | Status: DC

## 2021-01-14 MED ORDER — AZITHROMYCIN 250 MG PO TABS: ORAL_TABLET | ORAL | 0 refills | Status: AC

## 2021-01-14 NOTE — Progress Notes (Signed)
Date:  01/14/2021   Name:  Marie Martin   DOB:  03-31-1931   MRN:  [id number]   Chief Complaint: Cough  Cough Associated symptoms include shortness of breath. Pertinent negatives include no chest pain, ear pain, fever, headaches, hemoptysis, rash, rhinorrhea, sore throat or wheezing. Her past medical history is significant for COPD. There is no history of asthma.  Shortness of Breath This is a chronic problem. The current episode started more than 1 year ago. The problem occurs intermittently. The problem has been waxing and waning. Pertinent negatives include no abdominal pain, chest pain, claudication, coryza, ear pain, fever, headaches, hemoptysis, leg pain, leg swelling, neck pain, orthopnea, PND, rash, rhinorrhea, sore throat, sputum production, swollen glands, syncope, vomiting or wheezing. The symptoms are aggravated by pollens. She has tried beta agonist inhalers, steroid inhalers and leukotriene antagonists for the symptoms. Her past medical history is significant for COPD. There is no history of asthma.   Lab Results  Component Value Date   NA 142 07/11/2019   K 4.8 07/11/2019   CO2 23 07/11/2019   GLUCOSE 89 07/11/2019   BUN 22 07/11/2019   CREATININE 1.27 (H) 07/11/2019   CALCIUM 8.9 07/11/2019   GFRNONAA 38 (L) 07/11/2019   Lab Results  Component Value Date   CHOL 255 (H) 07/11/2019   HDL 61 07/11/2019   LDLCALC 169 (H) 07/11/2019   TRIG 141 07/11/2019   CHOLHDL 3.7 12/02/2015   No results found for: TSH No results found for: HGBA1C No results found for: WBC, HGB, HCT, MCV, PLT No results found for: ALT, AST, GGT, ALKPHOS, BILITOT No results found for: 25OHVITD2, 25OHVITD3, VD25OH   Review of Systems  Constitutional:  Negative for fever.  HENT:  Negative for ear pain, rhinorrhea and sore throat.   Respiratory:  Positive for cough and shortness of breath. Negative for hemoptysis, sputum production and wheezing.   Cardiovascular:  Negative for chest pain,  orthopnea, claudication, leg swelling, syncope and PND.  Gastrointestinal:  Negative for abdominal pain and vomiting.  Musculoskeletal:  Negative for neck pain.  Skin:  Negative for rash.  Neurological:  Negative for headaches.   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  Current Meds  Medication Sig   albuterol (PROVENTIL) (2.5 MG/3ML) 0.083% nebulizer solution USE 1 VIAL IN NEBULIZER EVERY 6 HOURS AS NEEDED FOR WHEEZING FOR SHORTNESS OF BREATH   albuterol (VENTOLIN HFA) 108 (90 Base) MCG/ACT inhaler INHALE 2 PUFFS BY MOUTH EVERY 6 HOURS AS NEEDED FOR WHEEZING FOR SHORTNESS OF BREATH   Ascorbic Acid (VITAMIN C WITH ROSE HIPS) 1000 MG tablet Take 1,000 mg by mouth daily.   Fluticasone-Umeclidin-Vilant (TRELEGY ELLIPTA) 100-62.5-25  MCG/INH AEPB Inhale 1 puff into the lungs daily.   Garlic 100 MG TABS Take by mouth.   Multiple Vitamins-Minerals (MULTIVITAMIN WOMENS 50+ ADV) TABS Take by mouth.   predniSONE (DELTASONE) 10 MG tablet Taper 6,6,6,5,5,5,4,4,3,3,2,2,1,1   sertraline (ZOLOFT) 100 MG tablet TAKE 1 TABLET BY MOUTH DAILY. GENERIC EQUIVALENT FOR ZOLOFT   vitamin B-12 (CYANOCOBALAMIN) 1000 MCG tablet Take 1,000 mcg by mouth daily.   vitamin E 180 MG (400  UNITS) capsule Take 400 Units by mouth daily.   Current Facility-Administered Medications for the 01/14/21 encounter (Office Visit) with Duanne Limerick, MD  Medication   ipratropium-albuterol (DUONEB) 0.5-2.5 (3) MG/3ML nebulizer solution 3 mL    PHQ 2/9 Scores 12/13/2020 10/29/2020 07/04/2020 06/03/2020  PHQ - 2 Score 0 0 0 2  PHQ- 9 Score 1 0 0 6    GAD 7 : Generalized Anxiety Score 12/13/2020 10/29/2020 07/04/2020 01/30/2020  Nervous, Anxious, on Edge 0 0 0 0  Control/stop worrying 0 0 0 0  Worry too much - different things 0 0 0 0  Trouble relaxing 0 0 0 0  Restless 0 0 0 0  Easily annoyed or irritable 0 0 0 0  Afraid - awful might happen 0 0 0 0  Total GAD 7 Score 0 0 0 0  Anxiety Difficulty Not difficult at all - - -    BP Readings from Last 3 Encounters:  01/14/21 124/70  12/13/20 128/78  10/29/20 130/80    Physical Exam  Wt Readings from Last 3 Encounters:  01/14/21 130 lb (59 kg)  12/13/20 129 lb 9.6 oz (58.8 kg)  10/29/20 130 lb (59 kg)    BP 124/70   Pulse 88   Ht 5\' 6"  (1.676 m)   Wt 130 lb (59 kg)   BMI 20.98 kg/m   Assessment and Plan:  1. Chronic obstructive pulmonary disease with acute exacerbation (HCC) Acute exacerbation of on a chronic underlying pulmonary problem..  She has been given Trelegy to continue upon in the meantime we will treat with prednisone 10 mg once a day for 2 weeks with a reserve of 2 weeks given the holidays. - predniSONE (DELTASONE) 10 MG tablet; Take 1 tablet (10 mg total) by mouth daily with breakfast.  Dispense: 30 tablet; Refill: 0  2. Acute maxillary sinusitis, recurrence not specified New onset.  Persistent.  Stable.  Patient has symptoms as well as exam consistent with an acute sinusitis and we will treat with azithromycin to 50 mg 2 today followed by 1 a day for 4 days. - azithromycin (ZITHROMAX) 250 MG tablet; Take 2 tablets on day 1, then 1 tablet daily on days 2 through 5  Dispense: 6 tablet; Refill: 0  3. Acute  cough Patient continues to have an acute cough which is for the most part nonproductive.  We will treat with guaifenesin with dextromethorphan 1 teaspoon every 4-6 hours as needed cough. - guaiFENesin-dextromethorphan (ROBITUSSIN DM) 100-10 MG/5ML syrup; Take 5 mLs by mouth every 4 (four) hours as needed for cough.  Dispense: 118 mL; Refill: 0

## 2021-01-17 ENCOUNTER — Telehealth: Payer: Self-pay | Admitting: Family Medicine

## 2021-01-17 NOTE — Telephone Encounter (Signed)
Pt is calling because [facility] to the [facility] for [facility]. However, pt states that she thought Dr. Yetta Barre was going to send Tessalon Perles to relieve coughing. Please advise [phone]

## 2021-02-06 ENCOUNTER — Other Ambulatory Visit: Payer: Self-pay | Admitting: Family Medicine

## 2021-02-06 DIAGNOSIS — J441 Chronic obstructive pulmonary disease with (acute) exacerbation: Secondary | ICD-10-CM

## 2021-02-06 MED ORDER — ALBUTEROL SULFATE (2.5 MG/3ML) 0.083% IN NEBU: INHALATION_SOLUTION | RESPIRATORY_TRACT | 0 refills | Status: DC

## 2021-02-06 NOTE — Addendum Note (Signed)
Addended by: Ross Ludwig on: 02/06/2021 02:56 PM   Modules accepted: Orders

## 2021-02-06 NOTE — Telephone Encounter (Signed)
Requested Prescriptions  Pending Prescriptions Disp Refills   albuterol (PROVENTIL) (2.5 MG/3ML) 0.083% nebulizer solution 150 mL 0    Sig: USE 1 VIAL IN NEBULIZER EVERY 6 HOURS AS NEEDED FOR WHEEZING FOR SHORTNESS OF BREATH Strength: (2.5 MG/3ML) 0.083%     Pulmonology:  Beta Agonists Failed - 02/06/2021 11:54 AM      Failed - One inhaler should last at least one month. If the patient is requesting refills earlier, contact the patient to check for uncontrolled symptoms.      Passed - Valid encounter within last 12 months    Recent Outpatient Visits          3 weeks ago Chronic obstructive pulmonary disease with acute exacerbation (HCC)   [facility] Duanne Limerick, MD   1 month ago Chronic obstructive pulmonary disease with acute exacerbation (HCC)   [facility] Duanne Limerick, MD   3 months ago Recurrent major depressive episodes [facility])   [facility] Duanne Limerick, MD   7 months ago COPD exacerbation [facility])   [facility] Duanne Limerick, MD   9 months ago COPD (chronic obstructive pulmonary disease) with acute bronchitis [facility])   [facility] Duanne Limerick, MD

## 2021-02-06 NOTE — Telephone Encounter (Signed)
Copied from CRM [id number]. Topic: Quick Communication - Rx Refill/Question >> Feb 06, 2021 11:00 AM Gaetana Michaelis A wrote: Medication: albuterol (PROVENTIL) (2.5 MG/3ML) 0.083% nebulizer solution [[id number]]   Has the patient contacted their pharmacy? No. The patient has had previous difficulty with their refill .   (Agent: If no, request that the patient contact the pharmacy for the refill. If patient does not wish to contact the pharmacy document the reason why and proceed with request.) (Agent: If yes, when and what did the pharmacy advise?)  Preferred Pharmacy (with phone number or street name): [facility] [address], [place] - [address]  Phone:  [phone] Fax:  [phone]  Has the patient been seen for an appointment in the last year OR does the patient have an upcoming appointment? Yes.    Agent: Please be advised that RX refills may take up to 3 business days. We ask that you follow-up with your pharmacy.

## 2021-03-23 DIAGNOSIS — S42351A Displaced comminuted fracture of shaft of humerus, right arm, initial encounter for closed fracture: Secondary | ICD-10-CM | POA: Diagnosis not present

## 2021-03-23 DIAGNOSIS — S0990XA Unspecified injury of head, initial encounter: Secondary | ICD-10-CM | POA: Diagnosis not present

## 2021-03-23 DIAGNOSIS — R42 Dizziness and giddiness: Secondary | ICD-10-CM | POA: Diagnosis not present

## 2021-03-23 DIAGNOSIS — Z20822 Contact with and (suspected) exposure to covid-19: Secondary | ICD-10-CM | POA: Diagnosis not present

## 2021-03-23 DIAGNOSIS — J984 Other disorders of lung: Secondary | ICD-10-CM | POA: Diagnosis not present

## 2021-03-23 DIAGNOSIS — R110 Nausea: Secondary | ICD-10-CM | POA: Diagnosis not present

## 2021-03-23 DIAGNOSIS — S199XXA Unspecified injury of neck, initial encounter: Secondary | ICD-10-CM | POA: Diagnosis not present

## 2021-03-23 DIAGNOSIS — S72011A Unspecified intracapsular fracture of right femur, initial encounter for closed fracture: Secondary | ICD-10-CM | POA: Diagnosis not present

## 2021-03-23 DIAGNOSIS — M47812 Spondylosis without myelopathy or radiculopathy, cervical region: Secondary | ICD-10-CM | POA: Diagnosis not present

## 2021-03-23 DIAGNOSIS — R918 Other nonspecific abnormal finding of lung field: Secondary | ICD-10-CM | POA: Diagnosis not present

## 2021-03-24 DIAGNOSIS — J811 Chronic pulmonary edema: Secondary | ICD-10-CM | POA: Diagnosis not present

## 2021-03-24 DIAGNOSIS — S42201A Unspecified fracture of upper end of right humerus, initial encounter for closed fracture: Secondary | ICD-10-CM | POA: Diagnosis not present

## 2021-03-24 DIAGNOSIS — S72041A Displaced fracture of base of neck of right femur, initial encounter for closed fracture: Secondary | ICD-10-CM | POA: Diagnosis not present

## 2021-03-24 DIAGNOSIS — J90 Pleural effusion, not elsewhere classified: Secondary | ICD-10-CM | POA: Diagnosis not present

## 2021-03-24 DIAGNOSIS — R9431 Abnormal electrocardiogram [ECG] [EKG]: Secondary | ICD-10-CM | POA: Diagnosis not present

## 2021-03-24 DIAGNOSIS — J449 Chronic obstructive pulmonary disease, unspecified: Secondary | ICD-10-CM | POA: Diagnosis not present

## 2021-03-24 DIAGNOSIS — R0902 Hypoxemia: Secondary | ICD-10-CM | POA: Diagnosis not present

## 2021-03-24 DIAGNOSIS — Z01818 Encounter for other preprocedural examination: Secondary | ICD-10-CM | POA: Diagnosis not present

## 2021-03-24 DIAGNOSIS — S72011A Unspecified intracapsular fracture of right femur, initial encounter for closed fracture: Secondary | ICD-10-CM | POA: Diagnosis not present

## 2021-03-24 DIAGNOSIS — S72001A Fracture of unspecified part of neck of right femur, initial encounter for closed fracture: Secondary | ICD-10-CM | POA: Diagnosis not present

## 2021-03-25 DIAGNOSIS — J432 Centrilobular emphysema: Secondary | ICD-10-CM | POA: Diagnosis not present

## 2021-03-25 DIAGNOSIS — R0902 Hypoxemia: Secondary | ICD-10-CM | POA: Diagnosis not present

## 2021-03-25 DIAGNOSIS — I272 Pulmonary hypertension, unspecified: Secondary | ICD-10-CM | POA: Diagnosis not present

## 2021-03-25 DIAGNOSIS — R509 Fever, unspecified: Secondary | ICD-10-CM | POA: Diagnosis not present

## 2021-03-25 DIAGNOSIS — I082 Rheumatic disorders of both aortic and tricuspid valves: Secondary | ICD-10-CM | POA: Diagnosis not present

## 2021-03-25 DIAGNOSIS — S72001A Fracture of unspecified part of neck of right femur, initial encounter for closed fracture: Secondary | ICD-10-CM | POA: Diagnosis not present

## 2021-03-26 DIAGNOSIS — Z96641 Presence of right artificial hip joint: Secondary | ICD-10-CM | POA: Diagnosis not present

## 2021-03-26 DIAGNOSIS — S72001A Fracture of unspecified part of neck of right femur, initial encounter for closed fracture: Secondary | ICD-10-CM | POA: Diagnosis not present

## 2021-03-26 DIAGNOSIS — S72051D Unspecified fracture of head of right femur, subsequent encounter for closed fracture with routine healing: Secondary | ICD-10-CM | POA: Diagnosis not present

## 2021-03-26 DIAGNOSIS — S72001D Fracture of unspecified part of neck of right femur, subsequent encounter for closed fracture with routine healing: Secondary | ICD-10-CM | POA: Diagnosis not present

## 2021-03-26 DIAGNOSIS — G8918 Other acute postprocedural pain: Secondary | ICD-10-CM | POA: Diagnosis not present

## 2021-03-26 DIAGNOSIS — Z471 Aftercare following joint replacement surgery: Secondary | ICD-10-CM | POA: Diagnosis not present

## 2021-03-27 DIAGNOSIS — J439 Emphysema, unspecified: Secondary | ICD-10-CM | POA: Diagnosis not present

## 2021-03-27 DIAGNOSIS — J9621 Acute and chronic respiratory failure with hypoxia: Secondary | ICD-10-CM | POA: Diagnosis not present

## 2021-03-27 DIAGNOSIS — I272 Pulmonary hypertension, unspecified: Secondary | ICD-10-CM | POA: Diagnosis not present

## 2021-03-27 DIAGNOSIS — F05 Delirium due to known physiological condition: Secondary | ICD-10-CM | POA: Diagnosis not present

## 2021-03-27 DIAGNOSIS — J9601 Acute respiratory failure with hypoxia: Secondary | ICD-10-CM | POA: Diagnosis not present

## 2021-03-27 DIAGNOSIS — J449 Chronic obstructive pulmonary disease, unspecified: Secondary | ICD-10-CM | POA: Diagnosis not present

## 2021-03-27 DIAGNOSIS — J90 Pleural effusion, not elsewhere classified: Secondary | ICD-10-CM | POA: Diagnosis not present

## 2021-03-27 DIAGNOSIS — J811 Chronic pulmonary edema: Secondary | ICD-10-CM | POA: Diagnosis not present

## 2021-03-28 DIAGNOSIS — J9611 Chronic respiratory failure with hypoxia: Secondary | ICD-10-CM | POA: Diagnosis not present

## 2021-03-28 DIAGNOSIS — S72001A Fracture of unspecified part of neck of right femur, initial encounter for closed fracture: Secondary | ICD-10-CM | POA: Diagnosis not present

## 2021-03-28 DIAGNOSIS — J449 Chronic obstructive pulmonary disease, unspecified: Secondary | ICD-10-CM | POA: Diagnosis not present

## 2021-03-28 DIAGNOSIS — R9431 Abnormal electrocardiogram [ECG] [EKG]: Secondary | ICD-10-CM | POA: Diagnosis not present

## 2021-03-28 DIAGNOSIS — J9601 Acute respiratory failure with hypoxia: Secondary | ICD-10-CM | POA: Diagnosis not present

## 2021-03-29 DIAGNOSIS — J449 Chronic obstructive pulmonary disease, unspecified: Secondary | ICD-10-CM | POA: Diagnosis not present

## 2021-03-29 DIAGNOSIS — N183 Chronic kidney disease, stage 3 unspecified: Secondary | ICD-10-CM | POA: Diagnosis not present

## 2021-03-29 DIAGNOSIS — J9621 Acute and chronic respiratory failure with hypoxia: Secondary | ICD-10-CM | POA: Diagnosis not present

## 2021-03-29 DIAGNOSIS — N179 Acute kidney failure, unspecified: Secondary | ICD-10-CM | POA: Diagnosis not present

## 2021-03-30 DIAGNOSIS — R918 Other nonspecific abnormal finding of lung field: Secondary | ICD-10-CM | POA: Diagnosis not present

## 2021-03-30 DIAGNOSIS — J9621 Acute and chronic respiratory failure with hypoxia: Secondary | ICD-10-CM | POA: Diagnosis not present

## 2021-03-30 DIAGNOSIS — J90 Pleural effusion, not elsewhere classified: Secondary | ICD-10-CM | POA: Diagnosis not present

## 2021-03-30 DIAGNOSIS — J9690 Respiratory failure, unspecified, unspecified whether with hypoxia or hypercapnia: Secondary | ICD-10-CM | POA: Diagnosis not present

## 2021-03-30 DIAGNOSIS — N179 Acute kidney failure, unspecified: Secondary | ICD-10-CM | POA: Diagnosis not present

## 2021-03-30 DIAGNOSIS — J189 Pneumonia, unspecified organism: Secondary | ICD-10-CM | POA: Diagnosis not present

## 2021-03-30 DIAGNOSIS — J449 Chronic obstructive pulmonary disease, unspecified: Secondary | ICD-10-CM | POA: Diagnosis not present

## 2021-03-30 DIAGNOSIS — J984 Other disorders of lung: Secondary | ICD-10-CM | POA: Diagnosis not present

## 2021-03-30 DIAGNOSIS — N183 Chronic kidney disease, stage 3 unspecified: Secondary | ICD-10-CM | POA: Diagnosis not present

## 2021-03-31 DIAGNOSIS — S72001A Fracture of unspecified part of neck of right femur, initial encounter for closed fracture: Secondary | ICD-10-CM | POA: Diagnosis not present

## 2021-03-31 DIAGNOSIS — J90 Pleural effusion, not elsewhere classified: Secondary | ICD-10-CM | POA: Diagnosis not present

## 2021-03-31 DIAGNOSIS — J9621 Acute and chronic respiratory failure with hypoxia: Secondary | ICD-10-CM | POA: Diagnosis not present

## 2021-03-31 DIAGNOSIS — R410 Disorientation, unspecified: Secondary | ICD-10-CM | POA: Diagnosis not present

## 2021-04-01 ENCOUNTER — Telehealth: Payer: Self-pay

## 2021-04-01 DIAGNOSIS — R410 Disorientation, unspecified: Secondary | ICD-10-CM | POA: Diagnosis not present

## 2021-04-01 DIAGNOSIS — J690 Pneumonitis due to inhalation of food and vomit: Secondary | ICD-10-CM | POA: Diagnosis not present

## 2021-04-01 DIAGNOSIS — I272 Pulmonary hypertension, unspecified: Secondary | ICD-10-CM | POA: Diagnosis not present

## 2021-04-01 DIAGNOSIS — J90 Pleural effusion, not elsewhere classified: Secondary | ICD-10-CM | POA: Diagnosis not present

## 2021-04-01 DIAGNOSIS — S72001A Fracture of unspecified part of neck of right femur, initial encounter for closed fracture: Secondary | ICD-10-CM | POA: Diagnosis not present

## 2021-04-01 DIAGNOSIS — J449 Chronic obstructive pulmonary disease, unspecified: Secondary | ICD-10-CM | POA: Diagnosis not present

## 2021-04-01 DIAGNOSIS — J9621 Acute and chronic respiratory failure with hypoxia: Secondary | ICD-10-CM | POA: Diagnosis not present

## 2021-04-01 NOTE — Telephone Encounter (Signed)
Called pt and left message to return my call.

## 2021-04-02 DIAGNOSIS — J9621 Acute and chronic respiratory failure with hypoxia: Secondary | ICD-10-CM | POA: Diagnosis not present

## 2021-04-02 DIAGNOSIS — J90 Pleural effusion, not elsewhere classified: Secondary | ICD-10-CM | POA: Diagnosis not present

## 2021-04-02 DIAGNOSIS — R410 Disorientation, unspecified: Secondary | ICD-10-CM | POA: Diagnosis not present

## 2021-04-02 DIAGNOSIS — J189 Pneumonia, unspecified organism: Secondary | ICD-10-CM | POA: Diagnosis not present

## 2021-04-03 DIAGNOSIS — J9611 Chronic respiratory failure with hypoxia: Secondary | ICD-10-CM | POA: Diagnosis not present

## 2021-04-03 DIAGNOSIS — J449 Chronic obstructive pulmonary disease, unspecified: Secondary | ICD-10-CM | POA: Diagnosis not present

## 2021-04-03 DIAGNOSIS — S72001A Fracture of unspecified part of neck of right femur, initial encounter for closed fracture: Secondary | ICD-10-CM | POA: Diagnosis not present

## 2021-04-04 DIAGNOSIS — J449 Chronic obstructive pulmonary disease, unspecified: Secondary | ICD-10-CM | POA: Diagnosis not present

## 2021-04-04 DIAGNOSIS — J9611 Chronic respiratory failure with hypoxia: Secondary | ICD-10-CM | POA: Diagnosis not present

## 2021-04-04 DIAGNOSIS — S72001A Fracture of unspecified part of neck of right femur, initial encounter for closed fracture: Secondary | ICD-10-CM | POA: Diagnosis not present

## 2021-04-04 DIAGNOSIS — R197 Diarrhea, unspecified: Secondary | ICD-10-CM | POA: Diagnosis not present

## 2021-04-05 DIAGNOSIS — S72001A Fracture of unspecified part of neck of right femur, initial encounter for closed fracture: Secondary | ICD-10-CM | POA: Diagnosis not present

## 2021-04-05 DIAGNOSIS — F32A Depression, unspecified: Secondary | ICD-10-CM | POA: Diagnosis not present

## 2021-04-06 DIAGNOSIS — R188 Other ascites: Secondary | ICD-10-CM | POA: Diagnosis not present

## 2021-04-06 DIAGNOSIS — R2231 Localized swelling, mass and lump, right upper limb: Secondary | ICD-10-CM | POA: Diagnosis not present

## 2021-04-06 DIAGNOSIS — S72001A Fracture of unspecified part of neck of right femur, initial encounter for closed fracture: Secondary | ICD-10-CM | POA: Diagnosis not present

## 2021-04-07 DIAGNOSIS — S72001A Fracture of unspecified part of neck of right femur, initial encounter for closed fracture: Secondary | ICD-10-CM | POA: Diagnosis not present

## 2021-04-08 DIAGNOSIS — K5900 Constipation, unspecified: Secondary | ICD-10-CM | POA: Diagnosis not present

## 2021-04-08 DIAGNOSIS — S72001A Fracture of unspecified part of neck of right femur, initial encounter for closed fracture: Secondary | ICD-10-CM | POA: Diagnosis not present

## 2021-04-09 DIAGNOSIS — S72001A Fracture of unspecified part of neck of right femur, initial encounter for closed fracture: Secondary | ICD-10-CM | POA: Diagnosis not present

## 2021-04-10 DIAGNOSIS — J432 Centrilobular emphysema: Secondary | ICD-10-CM | POA: Diagnosis not present

## 2021-04-10 DIAGNOSIS — J9611 Chronic respiratory failure with hypoxia: Secondary | ICD-10-CM | POA: Diagnosis not present

## 2021-04-10 DIAGNOSIS — I959 Hypotension, unspecified: Secondary | ICD-10-CM | POA: Diagnosis not present

## 2021-04-10 DIAGNOSIS — S72001A Fracture of unspecified part of neck of right femur, initial encounter for closed fracture: Secondary | ICD-10-CM | POA: Diagnosis not present

## 2021-04-10 DIAGNOSIS — S72001D Fracture of unspecified part of neck of right femur, subsequent encounter for closed fracture with routine healing: Secondary | ICD-10-CM | POA: Diagnosis not present

## 2021-04-11 DIAGNOSIS — S72001A Fracture of unspecified part of neck of right femur, initial encounter for closed fracture: Secondary | ICD-10-CM | POA: Diagnosis not present

## 2021-04-12 DIAGNOSIS — S72001A Fracture of unspecified part of neck of right femur, initial encounter for closed fracture: Secondary | ICD-10-CM | POA: Diagnosis not present

## 2021-04-13 DIAGNOSIS — E875 Hyperkalemia: Secondary | ICD-10-CM | POA: Diagnosis not present

## 2021-04-13 DIAGNOSIS — S72001A Fracture of unspecified part of neck of right femur, initial encounter for closed fracture: Secondary | ICD-10-CM | POA: Diagnosis not present

## 2021-04-13 DIAGNOSIS — Z96641 Presence of right artificial hip joint: Secondary | ICD-10-CM | POA: Diagnosis not present

## 2021-04-13 DIAGNOSIS — Z471 Aftercare following joint replacement surgery: Secondary | ICD-10-CM | POA: Diagnosis not present

## 2021-04-14 DIAGNOSIS — E875 Hyperkalemia: Secondary | ICD-10-CM | POA: Diagnosis not present

## 2021-04-14 DIAGNOSIS — S72001A Fracture of unspecified part of neck of right femur, initial encounter for closed fracture: Secondary | ICD-10-CM | POA: Diagnosis not present

## 2021-04-15 DIAGNOSIS — J449 Chronic obstructive pulmonary disease, unspecified: Secondary | ICD-10-CM | POA: Diagnosis not present

## 2021-04-15 DIAGNOSIS — Z66 Do not resuscitate: Secondary | ICD-10-CM | POA: Diagnosis not present

## 2021-04-15 DIAGNOSIS — F419 Anxiety disorder, unspecified: Secondary | ICD-10-CM | POA: Diagnosis not present

## 2021-04-15 DIAGNOSIS — D649 Anemia, unspecified: Secondary | ICD-10-CM | POA: Diagnosis not present

## 2021-04-15 DIAGNOSIS — S72001D Fracture of unspecified part of neck of right femur, subsequent encounter for closed fracture with routine healing: Secondary | ICD-10-CM | POA: Diagnosis not present

## 2021-04-15 DIAGNOSIS — Z96641 Presence of right artificial hip joint: Secondary | ICD-10-CM | POA: Diagnosis not present

## 2021-04-15 DIAGNOSIS — E1122 Type 2 diabetes mellitus with diabetic chronic kidney disease: Secondary | ICD-10-CM | POA: Diagnosis not present

## 2021-04-15 DIAGNOSIS — J9611 Chronic respiratory failure with hypoxia: Secondary | ICD-10-CM | POA: Diagnosis not present

## 2021-04-15 DIAGNOSIS — S72301D Unspecified fracture of shaft of right femur, subsequent encounter for closed fracture with routine healing: Secondary | ICD-10-CM | POA: Diagnosis not present

## 2021-04-15 DIAGNOSIS — S42301D Unspecified fracture of shaft of humerus, right arm, subsequent encounter for fracture with routine healing: Secondary | ICD-10-CM | POA: Diagnosis not present

## 2021-04-15 DIAGNOSIS — Z880 Allergy status to penicillin: Secondary | ICD-10-CM | POA: Diagnosis not present

## 2021-04-15 DIAGNOSIS — N183 Chronic kidney disease, stage 3 unspecified: Secondary | ICD-10-CM | POA: Diagnosis not present

## 2021-04-15 DIAGNOSIS — F32A Depression, unspecified: Secondary | ICD-10-CM | POA: Diagnosis not present

## 2021-04-15 DIAGNOSIS — S72091A Other fracture of head and neck of right femur, initial encounter for closed fracture: Secondary | ICD-10-CM | POA: Diagnosis not present

## 2021-04-15 DIAGNOSIS — S72001A Fracture of unspecified part of neck of right femur, initial encounter for closed fracture: Secondary | ICD-10-CM | POA: Diagnosis not present

## 2021-04-15 DIAGNOSIS — I272 Pulmonary hypertension, unspecified: Secondary | ICD-10-CM | POA: Diagnosis not present

## 2021-04-15 DIAGNOSIS — S42391A Other fracture of shaft of right humerus, initial encounter for closed fracture: Secondary | ICD-10-CM | POA: Diagnosis not present

## 2021-04-26 DIAGNOSIS — D631 Anemia in chronic kidney disease: Secondary | ICD-10-CM | POA: Diagnosis not present

## 2021-04-26 DIAGNOSIS — Z9181 History of falling: Secondary | ICD-10-CM | POA: Diagnosis not present

## 2021-04-26 DIAGNOSIS — F32A Depression, unspecified: Secondary | ICD-10-CM | POA: Diagnosis not present

## 2021-04-26 DIAGNOSIS — Z96641 Presence of right artificial hip joint: Secondary | ICD-10-CM | POA: Diagnosis not present

## 2021-04-26 DIAGNOSIS — N183 Chronic kidney disease, stage 3 unspecified: Secondary | ICD-10-CM | POA: Diagnosis not present

## 2021-04-26 DIAGNOSIS — S72001D Fracture of unspecified part of neck of right femur, subsequent encounter for closed fracture with routine healing: Secondary | ICD-10-CM | POA: Diagnosis not present

## 2021-04-26 DIAGNOSIS — Z87891 Personal history of nicotine dependence: Secondary | ICD-10-CM | POA: Diagnosis not present

## 2021-04-26 DIAGNOSIS — J9611 Chronic respiratory failure with hypoxia: Secondary | ICD-10-CM | POA: Diagnosis not present

## 2021-04-26 DIAGNOSIS — J449 Chronic obstructive pulmonary disease, unspecified: Secondary | ICD-10-CM | POA: Diagnosis not present

## 2021-04-26 DIAGNOSIS — S42301D Unspecified fracture of shaft of humerus, right arm, subsequent encounter for fracture with routine healing: Secondary | ICD-10-CM | POA: Diagnosis not present

## 2021-04-26 DIAGNOSIS — F419 Anxiety disorder, unspecified: Secondary | ICD-10-CM | POA: Diagnosis not present

## 2021-04-26 DIAGNOSIS — I272 Pulmonary hypertension, unspecified: Secondary | ICD-10-CM | POA: Diagnosis not present

## 2021-04-29 ENCOUNTER — Telehealth: Payer: Self-pay | Admitting: Family Medicine

## 2021-04-29 NOTE — Telephone Encounter (Signed)
Home Health Verbal Orders - Caller/Agency: [facility]  [facility] ? ?Callback Number: [phone] ? ?Requesting OT/PT/Skilled Nursing/Social Work/Speech Therapy: PT ? ?Frequency: 2 x 3 weeks, 1 week 5 ?    ?

## 2021-04-30 NOTE — Telephone Encounter (Signed)
Spoke to [place] gave her verbal orders. ? ?KP ?

## 2021-04-30 NOTE — Telephone Encounter (Signed)
Called Marie Martin left VM to call back. ? ?PEC nurse may give results to patient if they return call to clinic, a CRM has been created. ? ?KP ?

## 2021-05-14 ENCOUNTER — Telehealth: Payer: Self-pay | Admitting: Family Medicine

## 2021-05-14 NOTE — Telephone Encounter (Signed)
Copied from CRM [id number]. Topic: Quick Communication - Home Health Verbal Orders ?>> May 14, 2021  9:55 AM Jaquita Rector A wrote: ?Caller/Agency: Lupita Leash // [facility]  ?Callback Number: [phone] ok to LM  ?Requesting OT/PT/Skilled Nursing/Social Work/Speech Therapy: OT  ?Frequency: need eval 1w1 week of 05/26/21 ?

## 2021-05-14 NOTE — Telephone Encounter (Signed)
Called Lupita Leash left VM giving verbal orders. ? ?KP ?

## 2021-05-26 DIAGNOSIS — F419 Anxiety disorder, unspecified: Secondary | ICD-10-CM | POA: Diagnosis not present

## 2021-05-26 DIAGNOSIS — S72001D Fracture of unspecified part of neck of right femur, subsequent encounter for closed fracture with routine healing: Secondary | ICD-10-CM | POA: Diagnosis not present

## 2021-05-26 DIAGNOSIS — J449 Chronic obstructive pulmonary disease, unspecified: Secondary | ICD-10-CM | POA: Diagnosis not present

## 2021-05-26 DIAGNOSIS — Z87891 Personal history of nicotine dependence: Secondary | ICD-10-CM | POA: Diagnosis not present

## 2021-05-26 DIAGNOSIS — D631 Anemia in chronic kidney disease: Secondary | ICD-10-CM | POA: Diagnosis not present

## 2021-05-26 DIAGNOSIS — I272 Pulmonary hypertension, unspecified: Secondary | ICD-10-CM | POA: Diagnosis not present

## 2021-05-26 DIAGNOSIS — F32A Depression, unspecified: Secondary | ICD-10-CM | POA: Diagnosis not present

## 2021-05-26 DIAGNOSIS — Z96641 Presence of right artificial hip joint: Secondary | ICD-10-CM | POA: Diagnosis not present

## 2021-05-26 DIAGNOSIS — N183 Chronic kidney disease, stage 3 unspecified: Secondary | ICD-10-CM | POA: Diagnosis not present

## 2021-05-26 DIAGNOSIS — Z9181 History of falling: Secondary | ICD-10-CM | POA: Diagnosis not present

## 2021-05-26 DIAGNOSIS — S42301D Unspecified fracture of shaft of humerus, right arm, subsequent encounter for fracture with routine healing: Secondary | ICD-10-CM | POA: Diagnosis not present

## 2021-05-26 DIAGNOSIS — J9611 Chronic respiratory failure with hypoxia: Secondary | ICD-10-CM | POA: Diagnosis not present

## 2021-06-02 ENCOUNTER — Telehealth: Payer: Self-pay | Admitting: Family Medicine

## 2021-06-02 NOTE — Telephone Encounter (Signed)
Copied from CRM [id number]. Topic: General - Other ?>> Jun 02, 2021  4:07 PM Gaetana Michaelis A wrote: ?Reason for CRM: The patient would like to speak with Delice Bison when possible  ? ?The patient declined to elaborate on the reason for the call  ? ?Please contact further ?

## 2021-06-02 NOTE — Telephone Encounter (Signed)
Copied from CRM [id number]. Topic: General - Other ?>> May 30, 2021  3:57 PM Laural Benes, [place] C wrote: ?Reason for CRM: pt is requesting a call back from [place]. ?

## 2021-06-03 ENCOUNTER — Telehealth: Payer: Self-pay

## 2021-06-03 NOTE — Telephone Encounter (Signed)
Called pt- no answer left another message. Unsure of what pt wants ?

## 2021-06-04 ENCOUNTER — Ambulatory Visit: Payer: Medicare Other

## 2021-06-10 ENCOUNTER — Ambulatory Visit (INDEPENDENT_AMBULATORY_CARE_PROVIDER_SITE_OTHER): Payer: Medicare Other | Admitting: Family Medicine

## 2021-06-10 ENCOUNTER — Encounter: Payer: Self-pay | Admitting: Family Medicine

## 2021-06-10 ENCOUNTER — Other Ambulatory Visit: Payer: Self-pay | Admitting: Family Medicine

## 2021-06-10 VITALS — BP 120/76 | HR 76 | Ht 66.0 in | Wt 122.0 lb

## 2021-06-10 DIAGNOSIS — D500 Iron deficiency anemia secondary to blood loss (chronic): Secondary | ICD-10-CM

## 2021-06-10 DIAGNOSIS — J0100 Acute maxillary sinusitis, unspecified: Secondary | ICD-10-CM | POA: Diagnosis not present

## 2021-06-10 DIAGNOSIS — J441 Chronic obstructive pulmonary disease with (acute) exacerbation: Secondary | ICD-10-CM | POA: Diagnosis not present

## 2021-06-10 MED ORDER — IRON (FERROUS SULFATE) 325 (65 FE) MG PO TABS: 325.0000 mg | ORAL_TABLET | Freq: Every day | ORAL | 1 refills | Status: DC

## 2021-06-10 MED ORDER — AZITHROMYCIN 250 MG PO TABS: ORAL_TABLET | ORAL | 0 refills | Status: AC

## 2021-06-10 NOTE — Progress Notes (Signed)
? ? ?Date:  06/10/2021  ? ?Name:  Marie Martin   DOB:  15-Nov-1931   MRN:  [id number] ? ? ?Chief Complaint: Follow-up (S/p fall in January- hip and arm), Sinusitis (Getting yellow production), and COPD ? ?Sinusitis ?This is a chronic problem. The current episode started more than 1 year ago. The problem has been gradually improving since onset. There has been no fever. The pain is moderate. Pertinent negatives include no chills, congestion, coughing, diaphoresis, ear pain, headaches, hoarse voice, neck pain, shortness of breath, sinus pressure, sneezing, sore throat or swollen glands. The treatment provided moderate relief.  ?COPD ?She complains of sputum production. There is no cough, hoarse voice, shortness of breath or wheezing. The problem occurs intermittently. The problem has been waxing and waning. Pertinent negatives include no chest pain, ear pain, fever, headaches, malaise/fatigue, myalgias, sneezing, sore throat or weight loss. Her symptoms are aggravated by change in weather. Her symptoms are alleviated by beta-agonist, ipratropium and steroid inhaler. She reports significant improvement on treatment. Her past medical history is significant for COPD.  ?Anemia ?Presents for follow-up visit. There has been no abdominal pain, anorexia, bruising/bleeding easily, confusion, fever, light-headedness, malaise/fatigue, pallor, palpitations, paresthesias or weight loss. Signs of blood loss that are not present include hematemesis, hematochezia, melena, menorrhagia and vaginal bleeding.  ? ?Lab Results  ?Component Value Date  ? NA 142 07/11/2019  ? K 4.8 07/11/2019  ? CO2 23 07/11/2019  ? GLUCOSE 89 07/11/2019  ? BUN 22 07/11/2019  ? CREATININE 1.27 (H) 07/11/2019  ? CALCIUM 8.9 07/11/2019  ? GFRNONAA 38 (L) 07/11/2019  ? ?Lab Results  ?Component Value Date  ? CHOL 255 (H) 07/11/2019  ? HDL 61 07/11/2019  ? LDLCALC 169 (H) 07/11/2019  ? TRIG 141 07/11/2019  ? CHOLHDL 3.7 12/02/2015  ? ?No results found for: TSH ?No  results found for: HGBA1C ?No results found for: WBC, HGB, HCT, MCV, PLT ?No results found for: ALT, AST, GGT, ALKPHOS, BILITOT ?No results found for: 25OHVITD2, 25OHVITD3, VD25OH  ? ?Review of Systems  ?Constitutional:  Negative for chills, diaphoresis, fever, malaise/fatigue and weight loss.  ?HENT:  Negative for congestion, drooling, ear discharge, ear pain, hoarse voice, sinus pressure, sneezing and sore throat.   ?Respiratory:  Positive for sputum production. Negative for cough, shortness of breath and wheezing.   ?Cardiovascular:  Negative for chest pain, palpitations and leg swelling.  ?Gastrointestinal:  Negative for abdominal pain, anorexia, blood in stool, constipation, diarrhea, hematemesis, hematochezia, melena and nausea.  ?Endocrine: Negative for polydipsia.  ?Genitourinary:  Negative for dysuria, frequency, hematuria, menorrhagia, urgency and vaginal bleeding.  ?Musculoskeletal:  Negative for back pain, myalgias and neck pain.  ?Skin:  Negative for pallor and rash.  ?Allergic/Immunologic: Negative for environmental allergies.  ?Neurological:  Negative for dizziness, light-headedness, headaches and paresthesias.  ?Hematological:  Does not bruise/bleed easily.  ?Psychiatric/Behavioral:  Negative for confusion and suicidal ideas. The patient is not nervous/anxious.   ? ?Patient Active Problem List  ? Diagnosis Date Noted  ? COPD exacerbation (HCC) 02/22/2017  ? Cough 02/22/2017  ? Depression 02/22/2017  ? SOB (shortness of breath) 02/22/2017  ? 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 Recurrent major depressive episodes (HCC) 05/31/2014  ? ? ?Allergies  ?Allergen Reactions  ? Amoxicillin-Pot Clavulanate Nausea Only and Other (See Comments)  ?  Other Reaction: trouble swallowing  ? Penicillins   ? ? ?Past Surgical History:  ?Procedure Laterality Date  ? TONSILLECTOMY    ? ? ?Social History  ? ?  Tobacco Use  ? Smoking status: Former  ? Smokeless tobacco: Never  ?Vaping Use  ? Vaping Use: Never used   ?Substance Use Topics  ? Alcohol use: No  ?  Alcohol/week: 0.0 standard drinks  ? Drug use: No  ? ? ? ?Medication list has been reviewed and updated. ? ?Current Meds  ?Medication Sig  ? albuterol (PROVENTIL) (2.5 MG/3ML) 0.083% nebulizer solution USE 1 VIAL IN NEBULIZER EVERY 6 HOURS AS NEEDED FOR WHEEZING FOR SHORTNESS OF BREATH ?Strength: (2.5 MG/3ML) 0.083%  ? albuterol (VENTOLIN HFA) 108 (90 Base) MCG/ACT inhaler INHALE 2 PUFFS BY MOUTH EVERY 6 HOURS AS NEEDED FOR WHEEZING FOR SHORTNESS OF BREATH  ? Ascorbic Acid (VITAMIN C WITH ROSE HIPS) 1000 MG tablet Take 1,000 mg by mouth daily.  ? Fluticasone-Umeclidin-Vilant (TRELEGY ELLIPTA) 100-62.5-25 MCG/INH AEPB Inhale 1 puff into the lungs daily.  ? Garlic 100 MG TABS Take by mouth.  ? Multiple Vitamins-Minerals (MULTIVITAMIN WOMENS 50+ ADV) TABS Take by mouth.  ? sertraline (ZOLOFT) 100 MG tablet TAKE 1 TABLET BY MOUTH DAILY. GENERIC EQUIVALENT FOR ZOLOFT  ? vitamin B-12 (CYANOCOBALAMIN) 1000 MCG tablet Take 1,000 mcg by mouth daily.  ? vitamin E 180 MG (400 UNITS) capsule Take 400 Units by mouth daily.  ? ?Current Facility-Administered Medications for the 06/10/21 encounter (Office Visit) with Duanne LimerickJones, Lliam Hoh C, MD  ?Medication  ? ipratropium-albuterol (DUONEB) 0.5-2.5 (3) MG/3ML nebulizer solution 3 mL  ? ? ? ?  06/10/2021  ?  8:39 AM 12/13/2020  ?  1:24 PM 10/29/2020  ? 10:57 AM 07/04/2020  ?  1:16 PM  ?GAD 7 : Generalized Anxiety Score  ?Nervous, Anxious, on Edge 0 0 0 0  ?Control/stop worrying 0 0 0 0  ?Worry too much - different things 0 0 0 0  ?Trouble relaxing 0 0 0 0  ?Restless 0 0 0 0  ?Easily annoyed or irritable 0 0 0 0  ?Afraid - awful might happen 0 0 0 0  ?Total GAD 7 Score 0 0 0 0  ?Anxiety Difficulty Not difficult at all Not difficult at all    ? ? ? ?  06/10/2021  ?  8:38 AM  ?Depression screen PHQ 2/9  ?Decreased Interest 0  ?Down, Depressed, Hopeless 0  ?PHQ - 2 Score 0  ?Altered sleeping 0  ?Tired, decreased energy 0  ?Change in appetite 0  ?Feeling bad  or failure about yourself  0  ?Trouble concentrating 0  ?Moving slowly or fidgety/restless 0  ?Suicidal thoughts 0  ?PHQ-9 Score 0  ?Difficult doing work/chores Not difficult at all  ? ? ?BP Readings from Last 3 Encounters:  ?06/10/21 120/76  ?01/14/21 124/70  ?12/13/20 128/78  ? ? ?Physical Exam ?Vitals and nursing note reviewed.  ?Constitutional:   ?   Appearance: She is well-developed.  ?HENT:  ?   Head: Normocephalic.  ?   Right Ear: Tympanic membrane and external ear normal.  ?   Left Ear: Tympanic membrane and external ear normal.  ?   Nose: No congestion or rhinorrhea.  ?Eyes:  ?   General: Lids are everted, no foreign bodies appreciated. No scleral icterus.    ?   Left eye: No foreign body or hordeolum.  ?   Conjunctiva/sclera: Conjunctivae normal.  ?   Right eye: Right conjunctiva is not injected.  ?   Left eye: Left conjunctiva is not injected.  ?   Pupils: Pupils are equal, round, and reactive to light.  ?Neck:  ?  Thyroid: No thyromegaly.  ?   Vascular: No JVD.  ?   Trachea: No tracheal deviation.  ?Cardiovascular:  ?   Rate and Rhythm: Normal rate and regular rhythm.  ?   Heart sounds: Normal heart sounds, S1 normal and S2 normal. No murmur heard. ?No systolic murmur is present.  ?No diastolic murmur is present.  ?  No friction rub. No gallop. No S3 or S4 sounds.  ?Pulmonary:  ?   Effort: Pulmonary effort is normal. No respiratory distress.  ?   Breath sounds: Normal breath sounds. No decreased breath sounds, wheezing, rhonchi or rales.  ?Abdominal:  ?   General: Bowel sounds are normal.  ?   Palpations: Abdomen is soft. There is no mass.  ?   Tenderness: There is no abdominal tenderness. There is no guarding or rebound.  ?Musculoskeletal:     ?   General: No tenderness. Normal range of motion.  ?   Cervical back: Normal range of motion and neck supple.  ?Lymphadenopathy:  ?   Cervical: No cervical adenopathy.  ?Skin: ?   General: Skin is warm.  ?   Findings: No rash.  ?Neurological:  ?   Mental  Status: She is alert and oriented to person, place, and time.  ?   Cranial Nerves: No cranial nerve deficit.  ?   Deep Tendon Reflexes: Reflexes normal.  ?Psychiatric:     ?   Mood and Affect: Mood is not anxious

## 2021-06-11 LAB — CBC WITH DIFFERENTIAL/PLATELET
Basophils Absolute: 0.1 10*3/uL (ref 0.0–0.2)
Basos: 1 %
EOS (ABSOLUTE): 0.4 10*3/uL (ref 0.0–0.4)
Eos: 5 %
Hematocrit: 36.9 % (ref 34.0–46.6)
Hemoglobin: 12.3 g/dL (ref 11.1–15.9)
Immature Grans (Abs): 0 10*3/uL (ref 0.0–0.1)
Immature Granulocytes: 0 %
Lymphocytes Absolute: 1.7 10*3/uL (ref 0.7–3.1)
Lymphs: 21 %
MCH: 31.1 pg (ref 26.6–33.0)
MCHC: 33.3 g/dL (ref 31.5–35.7)
MCV: 93 fL (ref 79–97)
Monocytes Absolute: 0.6 10*3/uL (ref 0.1–0.9)
Monocytes: 8 %
Neutrophils Absolute: 5.4 10*3/uL (ref 1.4–7.0)
Neutrophils: 65 %
Platelets: 249 10*3/uL (ref 150–450)
RBC: 3.96 x10E6/uL (ref 3.77–5.28)
RDW: 13.8 % (ref 11.7–15.4)
WBC: 8.2 10*3/uL (ref 3.4–10.8)

## 2021-06-11 NOTE — Telephone Encounter (Signed)
Requested Prescriptions  ?Pending Prescriptions Disp Refills  ?? albuterol (PROVENTIL) (2.5 MG/3ML) 0.083% nebulizer solution [Pharmacy Med Name: Albuterol Sulfate (2.5 MG/3ML) 0.083% Inhalation Nebulization Solution] 180 mL 0  ?  Sig: USE 1 VIAL IN NEBULIZER EVERY 6 HOURS AS NEEDED FOR WHEEZING FOR SHORTNESS OF BREATH  ?  ? Pulmonology:  Beta Agonists 2 Passed - 06/10/2021  3:44 PM  ?  ?  Passed - Last BP in normal range  ?  BP Readings from Last 1 Encounters:  ?06/10/21 120/76  ?   ?  ?  Passed - Last Heart Rate in normal range  ?  Pulse Readings from Last 1 Encounters:  ?06/10/21 76  ?   ?  ?  Passed - Valid encounter within last 12 months  ?  Recent Outpatient Visits   ?      ? Yesterday Chronic obstructive pulmonary disease with acute exacerbation (HCC)  ? [facility] Duanne Limerick, MD  ? 4 months ago Chronic obstructive pulmonary disease with acute exacerbation (HCC)  ? [facility] Duanne Limerick, MD  ? 6 months ago Chronic obstructive pulmonary disease with acute exacerbation (HCC)  ? [facility] Duanne Limerick, MD  ? 7 months ago Recurrent major depressive episodes [facility])  ? [facility] Duanne Limerick, MD  ? 11 months ago COPD exacerbation [facility])  ? [facility] Duanne Limerick, MD  ?  ?  ? ?  ?  ?  ? ?

## 2021-06-17 ENCOUNTER — Telehealth: Payer: Self-pay | Admitting: Family Medicine

## 2021-06-17 NOTE — Telephone Encounter (Signed)
Copied from CRM [id number]. Topic: Medicare AWV ?>> Jun 17, 2021 10:43 AM Claudette Laws R wrote: ?Reason for CRM:  ?Left message for patient to call back and schedule Medicare Annual Wellness Visit (AWV) in office.  ? ?If unable to come into the office for AWV,  please offer to do virtually or by telephone. ? ?Last AWV: 06/03/2020 ? ?Please schedule at anytime with [facility] Health Advisor.     ? ?30 minute appointment for Virtual or phone ?45 minute appointment for in office or Initial virtual/phone ? ?Any questions, please call me at [phone] ?

## 2021-06-18 DIAGNOSIS — S42294A Other nondisplaced fracture of upper end of right humerus, initial encounter for closed fracture: Secondary | ICD-10-CM | POA: Diagnosis not present

## 2021-06-18 DIAGNOSIS — Z96641 Presence of right artificial hip joint: Secondary | ICD-10-CM | POA: Diagnosis not present

## 2021-06-18 DIAGNOSIS — S42211D Unspecified displaced fracture of surgical neck of right humerus, subsequent encounter for fracture with routine healing: Secondary | ICD-10-CM | POA: Diagnosis not present

## 2021-06-18 DIAGNOSIS — S42251D Displaced fracture of greater tuberosity of right humerus, subsequent encounter for fracture with routine healing: Secondary | ICD-10-CM | POA: Diagnosis not present

## 2021-06-18 DIAGNOSIS — S72001A Fracture of unspecified part of neck of right femur, initial encounter for closed fracture: Secondary | ICD-10-CM | POA: Diagnosis not present

## 2021-06-20 ENCOUNTER — Telehealth: Payer: Self-pay | Admitting: Family Medicine

## 2021-06-20 NOTE — Telephone Encounter (Signed)
Home Health Verbal Orders - Caller/Agency: Idell Pickles PT  ?Callback Number: [phone] ?Requesting OT/PT/Skilled Nursing/Social Work/Speech Therapy: PT  ?Frequency:  ? ?Continuation of care  ? ?1w4 ?1 every other week for 4 weeks  ? ?

## 2021-06-20 NOTE — Telephone Encounter (Signed)
Called [place] with verbal orders. She verbalized understanding. ? ?KP ?

## 2021-06-25 DIAGNOSIS — F32A Depression, unspecified: Secondary | ICD-10-CM | POA: Diagnosis not present

## 2021-06-25 DIAGNOSIS — Z87891 Personal history of nicotine dependence: Secondary | ICD-10-CM | POA: Diagnosis not present

## 2021-06-25 DIAGNOSIS — S72011D Unspecified intracapsular fracture of right femur, subsequent encounter for closed fracture with routine healing: Secondary | ICD-10-CM | POA: Diagnosis not present

## 2021-06-25 DIAGNOSIS — Z9981 Dependence on supplemental oxygen: Secondary | ICD-10-CM | POA: Diagnosis not present

## 2021-06-25 DIAGNOSIS — J9611 Chronic respiratory failure with hypoxia: Secondary | ICD-10-CM | POA: Diagnosis not present

## 2021-06-25 DIAGNOSIS — F419 Anxiety disorder, unspecified: Secondary | ICD-10-CM | POA: Diagnosis not present

## 2021-06-25 DIAGNOSIS — I272 Pulmonary hypertension, unspecified: Secondary | ICD-10-CM | POA: Diagnosis not present

## 2021-06-25 DIAGNOSIS — Z96641 Presence of right artificial hip joint: Secondary | ICD-10-CM | POA: Diagnosis not present

## 2021-06-25 DIAGNOSIS — J449 Chronic obstructive pulmonary disease, unspecified: Secondary | ICD-10-CM | POA: Diagnosis not present

## 2021-06-25 DIAGNOSIS — S42301D Unspecified fracture of shaft of humerus, right arm, subsequent encounter for fracture with routine healing: Secondary | ICD-10-CM | POA: Diagnosis not present

## 2021-06-25 DIAGNOSIS — Z9181 History of falling: Secondary | ICD-10-CM | POA: Diagnosis not present

## 2021-06-25 DIAGNOSIS — D631 Anemia in chronic kidney disease: Secondary | ICD-10-CM | POA: Diagnosis not present

## 2021-06-25 DIAGNOSIS — N183 Chronic kidney disease, stage 3 unspecified: Secondary | ICD-10-CM | POA: Diagnosis not present

## 2021-07-08 ENCOUNTER — Other Ambulatory Visit: Payer: Self-pay | Admitting: Family Medicine

## 2021-07-08 DIAGNOSIS — J441 Chronic obstructive pulmonary disease with (acute) exacerbation: Secondary | ICD-10-CM

## 2021-07-08 NOTE — Telephone Encounter (Signed)
Medication Refill - Fluticasone-Umeclidin-Vilant (TRELEGY ELLIPTA) 100-62.5-25 MCG/INH AEPB  Has the patient contacted their pharmacy? No  (Agent: If yes, when and what did the pharmacy advise?) Patient asked her daughter to call because she did not have a good connection  Preferred Pharmacy (with phone number or street name):  [place] Phone:  [phone]  Fax:  [phone]      Has the patient been seen for an appointment in the last year OR does the patient have an upcoming appointment? Yes.    Agent: Please be advised that RX refills may take up to 3 business days. We ask that you follow-up with your pharmacy.

## 2021-07-09 NOTE — Telephone Encounter (Signed)
Requested medication (s) are due for refill today: yes  Requested medication (s) are on the active medication list: yes  Last refill:  05/09/20 60 each   Future visit scheduled: no  Notes to clinic:  med not assigned to a protocol   Requested Prescriptions  Pending Prescriptions Disp Refills   Fluticasone-Umeclidin-Vilant (TRELEGY ELLIPTA) 100-62.5-25 MCG/ACT AEPB 60 each 0    Sig: Inhale 1 puff into the lungs daily.     There is no refill protocol information for this order

## 2021-07-10 ENCOUNTER — Other Ambulatory Visit: Payer: Self-pay

## 2021-07-10 DIAGNOSIS — J441 Chronic obstructive pulmonary disease with (acute) exacerbation: Secondary | ICD-10-CM

## 2021-07-10 MED ORDER — FLUTICASONE-UMECLIDIN-VILANT 100-62.5-25 MCG/ACT IN AEPB: 1.0000 | INHALATION_SPRAY | Freq: Every day | RESPIRATORY_TRACT | 0 refills | Status: DC

## 2021-07-15 ENCOUNTER — Telehealth: Payer: Self-pay | Admitting: Family Medicine

## 2021-07-15 NOTE — Telephone Encounter (Signed)
Copied from CRM [id number]. Topic: Medicare AWV >> Jul 15, 2021 12:21 PM Claudette Laws R wrote: Reason for CRM:  No answer unable to leave a message for patient to call back and schedule Medicare Annual Wellness Visit (AWV) in office.   If unable to come into the office for AWV,  please offer to do virtually or by telephone.  Last AWV: 06/03/2020  Please schedule at anytime with [facility] Health Advisor.      30 minute appointment for Virtual or phone 45 minute appointment for in office or Initial virtual/phone  Any questions, please call me at [phone]

## 2021-07-25 DIAGNOSIS — Z9181 History of falling: Secondary | ICD-10-CM | POA: Diagnosis not present

## 2021-07-25 DIAGNOSIS — S42301D Unspecified fracture of shaft of humerus, right arm, subsequent encounter for fracture with routine healing: Secondary | ICD-10-CM | POA: Diagnosis not present

## 2021-07-25 DIAGNOSIS — J9611 Chronic respiratory failure with hypoxia: Secondary | ICD-10-CM | POA: Diagnosis not present

## 2021-07-25 DIAGNOSIS — D631 Anemia in chronic kidney disease: Secondary | ICD-10-CM | POA: Diagnosis not present

## 2021-07-25 DIAGNOSIS — J449 Chronic obstructive pulmonary disease, unspecified: Secondary | ICD-10-CM | POA: Diagnosis not present

## 2021-07-25 DIAGNOSIS — S72011D Unspecified intracapsular fracture of right femur, subsequent encounter for closed fracture with routine healing: Secondary | ICD-10-CM | POA: Diagnosis not present

## 2021-07-25 DIAGNOSIS — I272 Pulmonary hypertension, unspecified: Secondary | ICD-10-CM | POA: Diagnosis not present

## 2021-07-25 DIAGNOSIS — Z9981 Dependence on supplemental oxygen: Secondary | ICD-10-CM | POA: Diagnosis not present

## 2021-07-25 DIAGNOSIS — F32A Depression, unspecified: Secondary | ICD-10-CM | POA: Diagnosis not present

## 2021-07-25 DIAGNOSIS — F419 Anxiety disorder, unspecified: Secondary | ICD-10-CM | POA: Diagnosis not present

## 2021-07-25 DIAGNOSIS — N183 Chronic kidney disease, stage 3 unspecified: Secondary | ICD-10-CM | POA: Diagnosis not present

## 2021-07-25 DIAGNOSIS — Z96641 Presence of right artificial hip joint: Secondary | ICD-10-CM | POA: Diagnosis not present

## 2021-07-25 DIAGNOSIS — Z87891 Personal history of nicotine dependence: Secondary | ICD-10-CM | POA: Diagnosis not present

## 2021-08-13 ENCOUNTER — Telehealth: Payer: Self-pay | Admitting: Family Medicine

## 2021-08-13 NOTE — Telephone Encounter (Signed)
Copied from CRM [id number]. Topic: Medicare AWV >> Aug 13, 2021 10:35 AM Zannie Kehr wrote: Reason for CRM:  No answer unable to leave a  message for patient to call back and schedule Medicare Annual Wellness Visit (AWV) in office.   If unable to come into the office for AWV,  please offer to do virtually or by telephone.  Last AWV: 06/03/2020  Please schedule at anytime with [facility] Health Advisor.      30 minute appointment for Virtual or phone 45 minute appointment for in office or Initial virtual/phone  Any questions, please call me at [phone]

## 2021-08-25 ENCOUNTER — Ambulatory Visit (INDEPENDENT_AMBULATORY_CARE_PROVIDER_SITE_OTHER): Payer: Medicare Other

## 2021-08-25 DIAGNOSIS — Z0000 Encounter for general adult medical examination without abnormal findings: Secondary | ICD-10-CM | POA: Diagnosis not present

## 2021-08-25 NOTE — Progress Notes (Signed)
I connected with  Marie BearsBeverly N Ewald on 08/25/21 by a audio enabled telemedicine application and verified that I am speaking with the correct person using two identifiers.  Patient Location: Home  Provider Location: Office/Clinic  I discussed the limitations of evaluation and management by telemedicine. The patient expressed understanding and agreed to proceed.  Subjective:   Marie Martin is a 88 y.o. female who presents for Medicare Annual (Subsequent) preventive examination.  Review of Systems    Per HPI unless specifically indicated below  Cardiac Risk Factors include: advanced age (>[id number], 72>65 women)    Objective:       06/10/2021    8:37 AM 01/14/2021   11:19 AM 12/13/2020    1:19 PM  Vitals with BMI  Height 5\' 6"  5\' 6"  5\' 6"   Weight 122 lbs 130 lbs 129 lbs 10 oz  BMI 19.7 20.99 20.93  Systolic 120 124 [id number]  Diastolic 76 70 78  Pulse 76 88 78    There were no vitals filed for this visit. There is no height or weight on file to calculate BMI.     06/03/2020    2:15 PM 05/24/2019    2:53 PM 12/14/2014   10:27 AM  Advanced Directives  Does Patient Have a Medical Advance Directive? No No Yes  Type of Best boyAdvance Directive   Healthcare Power of TaftAttorney;Living will  Copy of Healthcare Power of Attorney in Chart?   No - copy requested  Would patient like information on creating a medical advance directive? Yes (MAU/Ambulatory/Procedural Areas - Information given) Yes (MAU/Ambulatory/Procedural Areas - Information given)     Current Medications (verified) Outpatient Encounter Medications as of 08/25/2021  Medication Sig   albuterol (PROVENTIL) (2.5 MG/3ML) 0.083% nebulizer solution USE 1 VIAL IN NEBULIZER EVERY 6 HOURS AS NEEDED FOR WHEEZING FOR SHORTNESS OF BREATH   albuterol (VENTOLIN HFA) 108 (90 Base) MCG/ACT inhaler INHALE 2 PUFFS BY MOUTH EVERY 6 HOURS AS NEEDED FOR WHEEZING FOR SHORTNESS OF BREATH   Ascorbic Acid (VITAMIN C WITH ROSE HIPS) 1000 MG tablet Take 1,000 mg  by mouth daily.   Fluticasone-Umeclidin-Vilant (TRELEGY ELLIPTA) 100-62.5-25 MCG/ACT AEPB Inhale 1 puff into the lungs daily.   Garlic 100 MG TABS Take by mouth.   Iron, Ferrous Sulfate, 325 (65 Fe) MG TABS Take 325 mg by mouth daily.   Multiple Vitamins-Minerals (MULTIVITAMIN WOMENS 50+ ADV) TABS Take by mouth.   sertraline (ZOLOFT) 100 MG tablet TAKE 1 TABLET BY MOUTH DAILY. GENERIC EQUIVALENT FOR ZOLOFT   vitamin B-12 (CYANOCOBALAMIN) 1000 MCG tablet Take 1,000 mcg by mouth daily.   vitamin E 180 MG (400 UNITS) capsule Take 400 Units by mouth daily.   predniSONE (DELTASONE) 10 MG tablet Take 1 tablet (10 mg total) by mouth daily with breakfast. (Patient not taking: Reported on 06/10/2021)   Facility-Administered Encounter Medications as of 08/25/2021  Medication   ipratropium-albuterol (DUONEB) 0.5-2.5 (3) MG/3ML nebulizer solution 3 mL    Allergies (verified) Amoxicillin-pot clavulanate and Penicillins   History: Past Medical History:  Diagnosis Date   COPD (chronic obstructive pulmonary disease) (HCC)    Depression    Past Surgical History:  Procedure Laterality Date   TONSILLECTOMY     No family history on file. Social History   Socioeconomic History   Marital status: Widowed    Spouse name: Not on file   Number of children: 4   Years of education: Not on file   Highest education level: Not on file  Occupational  History   Occupation: Retired  Tobacco Use   Smoking status: Former   Smokeless tobacco: Never  Building services engineer Use: Never used  Substance and Sexual Activity   Alcohol use: No    Alcohol/week: 0.0 standard drinks of alcohol   Drug use: No   Sexual activity: Not on file  Other Topics Concern   Not on file  Social History Narrative   Pt lives with her daughter and has a son in [place]      Has 2 sons that have passed away   Social Determinants of Health   Financial Resource Strain: Low Risk  (08/25/2021)   Overall Financial Resource Strain  (CARDIA)    Difficulty of Paying Living Expenses: Not hard at all  Food Insecurity: No Food Insecurity (08/25/2021)   Hunger Vital Sign    Worried About Running Out of Food in the Last Year: Never true    Ran Out of Food in the Last Year: Never true  Transportation Needs: No Transportation Needs (08/25/2021)   PRAPARE - Administrator, Civil Service (Medical): No    Lack of Transportation (Non-Medical): No  Physical Activity: Insufficiently Active (08/25/2021)   Exercise Vital Sign    Days of Exercise per Week: 5 days    Minutes of Exercise per Session: 20 min  Stress: No Stress Concern Present (08/25/2021)   [employer] of Occupational Health - Occupational Stress Questionnaire    Feeling of Stress : Not at all  Social Connections: Moderately Integrated (08/25/2021)   Social Connection and Isolation Panel [NHANES]    Frequency of Communication with Friends and Family: More than three times a week    Frequency of Social Gatherings with Friends and Family: More than three times a week    Attends Religious Services: More than 4 times per year    Active Member of [employer] or Organizations: Yes    Attends Banker Meetings: Never    Marital Status: Widowed    Tobacco Counseling Not required   Clinical Intake:  Pre-visit preparation completed: No  Pain : No/denies pain     Nutritional Status: BMI of 19-24  Normal Nutritional Risks: None Diabetes: No  How often do you need to have someone help you when you read instructions, pamphlets, or other written materials from your doctor or pharmacy?: 1 - Never  Diabetic?No  Interpreter Needed?: No  Information entered by :: Laurel Dimmer, CMA   Activities of Daily Living    08/25/2021    2:51 PM 06/10/2021    8:39 AM  In your present state of health, do you have any difficulty performing the following activities:  Hearing? 1 1  Vision? 1 0  Difficulty concentrating or making decisions? 0 0  Walking or  climbing stairs? 1 1  Dressing or bathing? 1 1  Doing errands, shopping? 0 1    Patient Care Team: Duanne Limerick, MD as PCP - General (Family Medicine) Mertie Moores, MD as Referring Physician (Specialist)  Indicate any recent Medical Services you may have received from other than [facility] providers in the past year (date may be approximate).     Assessment:   This is a routine wellness examination for [place].  Hearing/Vision screen Admits hearing loss Annual vision screening, wear glasses   Dietary issues and exercise activities discussed:   Pt admitted in the hospital on 04/15/2021 for a closed displaced fracture of the right femoral neck.    Goals Addressed  None    Depression Screen    06/10/2021    8:38 AM 12/13/2020    1:24 PM 10/29/2020   10:57 AM 07/04/2020    1:16 PM 06/03/2020    2:09 PM 01/30/2020   11:43 AM 09/11/2019   12:07 PM  PHQ 2/9 Scores  PHQ - 2 Score 0 0 0 0 2 0 0  PHQ- 9 Score 0 1 0 0 6 0 0    Fall Risk    08/25/2021    2:50 PM 06/10/2021    8:39 AM 12/13/2020    1:24 PM 07/04/2020    1:16 PM 06/03/2020    2:17 PM  Fall Risk   Falls in the past year? 0 1 0 0 0  Number falls in past yr: 0 0 0  0  Injury with Fall? 0 1 0  0  Risk for fall due to : History of fall(s) Impaired balance/gait;History of fall(s);Impaired mobility No Fall Risks  No Fall Risks  Follow up Falls evaluation completed Falls evaluation completed;Falls prevention discussed Follow up appointment Falls evaluation completed Falls prevention discussed    FALL RISK PREVENTION PERTAINING TO THE HOME:  Any stairs in or around the home? No  If so, are there any without handrails? Yes  Home free of loose throw rugs in walkways, pet beds, electrical cords, etc? Yes  Adequate lighting in your home to reduce risk of falls? Yes   ASSISTIVE DEVICES UTILIZED TO PREVENT FALLS:  Life alert? No  Use of a cane, walker or w/c? Yes  Grab bars in the bathroom? Yes  Shower chair or bench in  shower? Yes  Elevated toilet seat or a handicapped toilet? Yes   TIMED UP AND GO:  Was the test performed? No .  Virtual visit    Cognitive Function:    10/29/2020   11:09 AM  MMSE - Mini Mental State Exam  Orientation to time 5  Orientation to Place 5  Registration 3  Attention/ Calculation 5  Recall 3  Language- name 2 objects 2  Language- repeat 1  Language- follow 3 step command 3  Language- read & follow direction 1  Write a sentence 1  Copy design 1  Total score 30        08/25/2021    2:55 PM 10/29/2020   11:03 AM  6CIT Screen  What Year? 0 points 0 points  What month? 0 points 0 points  What time? 0 points 0 points  Count back from 20 0 points 0 points  Months in reverse 0 points 0 points  Repeat phrase 0 points 0 points  Total Score 0 points 0 points    Immunizations Immunization History  Administered Date(s) Administered   Fluad Quad(high Dose 65+) 10/03/2018, 12/06/2019, 10/29/2020   Influenza, High Dose Seasonal PF 10/21/2017   Influenza,inj,Quad PF,6+ Mos 01/14/2015, 12/02/2015, 10/01/2016   Influenza-Unspecified 11/14/2013, 01/14/2015, 12/02/2015, 10/01/2016   PFIZER Comirnaty(Gray Top)Covid-19 Tri-Sucrose Vaccine 06/17/2020   PFIZER(Purple Top)SARS-COV-2 Vaccination 04/19/2019, 05/17/2019, 11/18/2019   Pfizer Covid-19 Vaccine Bivalent Booster 82yrs & up 12/06/2020   Pneumococcal Conjugate-13 10/01/2016   Pneumococcal Polysaccharide-23 08/02/2012   Zoster, Live 07/22/2010    TDAP status: Due, Education has been provided regarding the importance of this vaccine. Advised may receive this vaccine at local pharmacy or Health Dept. Aware to provide a copy of the vaccination record if obtained from local pharmacy or Health Dept. Verbalized acceptance and understanding.  Flu Vaccine status: Up to date  Pneumococcal vaccine status:  Up to date  Covid-19 vaccine status: Completed vaccines  Qualifies for Shingles Vaccine? Yes   Zostavax completed Yes    Shingrix Completed?: No.    Education has been provided regarding the importance of this vaccine. Patient has been advised to call insurance company to determine out of pocket expense if they have not yet received this vaccine. Advised may also receive vaccine at local pharmacy or Health Dept. Verbalized acceptance and understanding.  Screening Tests Health Maintenance  Topic Date Due   COVID-19 Vaccine (6 - Pfizer series) 04/08/2021   Zoster Vaccines- Shingrix (1 of 2) 09/10/2021 (Originally 03/08/1981)   INFLUENZA VACCINE  09/09/2021   Pneumonia Vaccine 39+ Years old  Completed   HPV VACCINES  Aged Out   DEXA SCAN  Discontinued   TETANUS/TDAP  Discontinued    Health Maintenance  Health Maintenance Due  Topic Date Due   COVID-19 Vaccine (6 - Pfizer series) 04/08/2021    Colorectal cancer screening: No longer required.   Mammogram: No longer required  DEXA Scan: no longer required   Lung Cancer Screening: (Low Dose CT Chest recommended if Age 68-80 years, 30 pack-year currently smoking OR have quit w/in 15years.) does not qualify.   Lung Cancer Screening Referral: does not qualify  Additional Screening:  Hepatitis C Screening: does qualify. No longer required   Vision Screening: Recommended annual ophthalmology exams for early detection of glaucoma and other disorders of the eye. Is the patient up to date with their annual eye exam?  Yes  Who is the provider or what is the name of the office in which the patient attends annual eye exams?  If pt is not established with a provider, would they like to be referred to a provider to establish care? No .   Dental Screening: Recommended annual dental exams for proper oral hygiene  Community Resource Referral / Chronic Care Management: CRR required this visit?  No   CCM required this visit?  No     Advanced directives: Advance directive discussed with you today. I have provided a copy for you to complete at home and have  notarized. Once this is complete please bring a copy in to our office so we can scan it into your chart.   Conditions/risks identified: Recommend drinking 6-8 glasses of water per day    Next appointment: Follow up in one year for your annual wellness visit    Plan:     I have personally reviewed and noted the following in the patient's chart:   Medical and social history Use of alcohol, tobacco or illicit drugs  Current medications and supplements including opioid prescriptions.  Functional ability and status Nutritional status Physical activity Advanced directives List of other physicians Hospitalizations, surgeries, and ER visits in previous 12 months Vitals Screenings to include cognitive, depression, and falls Referrals and appointments  In addition, I have reviewed and discussed with patient certain preventive protocols, quality metrics, and best practice recommendations. A written personalized care plan for preventive services as well as general preventive health recommendations were provided to patient.    Ms. Bouffard , Thank you for taking time to come for your Medicare Wellness Visit. I appreciate your ongoing commitment to your health goals. Please review the following plan we discussed and let me know if I can assist you in the future.   These are the goals we discussed:  Goals      DIET - INCREASE WATER INTAKE     Recommend drinking 6-8 glasses  of water per day      Patient Stated     Pt would like to be able to travel to more        This is a list of the screening recommended for you and due dates:  Health Maintenance  Topic Date Due   COVID-19 Vaccine (6 - Pfizer series) 04/08/2021   Zoster (Shingles) Vaccine (1 of 2) 09/10/2021*   Flu Shot  09/09/2021   Pneumonia Vaccine  Completed   HPV Vaccine  Aged Out   DEXA scan (bone density measurement)  Discontinued   Tetanus Vaccine  Discontinued  *Topic was postponed. The date shown is not the original due  date.     Lonna Cobb, CMA   08/25/2021   Nurse Notes: none

## 2021-08-25 NOTE — Patient Instructions (Signed)

## 2021-08-28 ENCOUNTER — Other Ambulatory Visit: Payer: Self-pay | Admitting: Family Medicine

## 2021-08-28 DIAGNOSIS — J441 Chronic obstructive pulmonary disease with (acute) exacerbation: Secondary | ICD-10-CM

## 2021-08-29 ENCOUNTER — Telehealth: Payer: Self-pay

## 2021-08-29 ENCOUNTER — Other Ambulatory Visit: Payer: Self-pay

## 2021-08-29 ENCOUNTER — Telehealth: Payer: Self-pay | Admitting: Family Medicine

## 2021-08-29 DIAGNOSIS — J441 Chronic obstructive pulmonary disease with (acute) exacerbation: Secondary | ICD-10-CM

## 2021-08-29 MED ORDER — TRELEGY ELLIPTA 100-62.5-25 MCG/ACT IN AEPB: INHALATION_SPRAY | RESPIRATORY_TRACT | 1 refills | Status: DC

## 2021-08-29 NOTE — Telephone Encounter (Signed)
I returned pt's call- had to leave message, unsure of what she wanted

## 2021-08-29 NOTE — Telephone Encounter (Signed)
Called pt again no answer

## 2021-08-29 NOTE — Telephone Encounter (Signed)
Requested medication (s) are due for refill today:   Yes  Requested medication (s) are on the active medication list:   Yes  Future visit scheduled:   No   Last ordered: 6/1/223 60 each, 0 refills  Returned because no protocol assigned to this medication    Requested Prescriptions  Pending Prescriptions Disp Refills   TRELEGY ELLIPTA 100-62.5-25 MCG/ACT AEPB [Pharmacy Med Name: Trelegy Ellipta 100-62.5-25 MCG/INH Inhalation Aerosol Powder Breath Activated] 60 each 0    Sig: INHALE 1 PUFF ONCE DAILY     Off-Protocol Failed - 08/28/2021  3:43 PM      Failed - Medication not assigned to a protocol, review manually.      Passed - Valid encounter within last 12 months    Recent Outpatient Visits           2 months ago Chronic obstructive pulmonary disease with acute exacerbation (HCC)   [facility] Duanne Limerick, MD   7 months ago Chronic obstructive pulmonary disease with acute exacerbation (HCC)   [facility] Duanne Limerick, MD   8 months ago Chronic obstructive pulmonary disease with acute exacerbation (HCC)   [facility] Duanne Limerick, MD   10 months ago Recurrent major depressive episodes [facility])   [facility] Duanne Limerick, MD   1 year ago COPD exacerbation [facility])   [facility] Duanne Limerick, MD

## 2021-08-29 NOTE — Telephone Encounter (Signed)
Copied from CRM [id number]. Topic: General - Other >> Aug 29, 2021 12:55 PM Ja-Kwan M wrote: Reason for CRM: Pt returned call to [facility] Pt request return call

## 2021-08-29 NOTE — Telephone Encounter (Signed)
Duplicate message  KP 

## 2021-08-29 NOTE — Telephone Encounter (Signed)
Pt returned call during lunch 

## 2021-08-29 NOTE — Telephone Encounter (Signed)
Copied from CRM [id number]. Topic: General - Other >> Aug 29, 2021 10:47 AM Macon Large wrote: Reason for CRM: Pt requests that Delice Bison return her call asap. Pt did not provide any further details. Cb# [phone]

## 2021-09-11 ENCOUNTER — Telehealth: Payer: Self-pay | Admitting: Family Medicine

## 2021-09-11 NOTE — Telephone Encounter (Signed)
Called pt let her know that she can get the medication OTC. Pt verbalized understanding.  KP

## 2021-09-11 NOTE — Telephone Encounter (Signed)
pt would like Marie Martin to give her a call about a medication she received Salonpas 4% / she would like an prescription for this sent to [facility] in [place]/ please advise

## 2021-09-24 DIAGNOSIS — Z96641 Presence of right artificial hip joint: Secondary | ICD-10-CM | POA: Diagnosis not present

## 2021-09-24 DIAGNOSIS — Z09 Encounter for follow-up examination after completed treatment for conditions other than malignant neoplasm: Secondary | ICD-10-CM | POA: Diagnosis not present

## 2021-09-24 DIAGNOSIS — Z96649 Presence of unspecified artificial hip joint: Secondary | ICD-10-CM | POA: Diagnosis not present

## 2021-10-22 ENCOUNTER — Ambulatory Visit (INDEPENDENT_AMBULATORY_CARE_PROVIDER_SITE_OTHER): Payer: Medicare Other | Admitting: Family Medicine

## 2021-10-22 ENCOUNTER — Encounter: Payer: Self-pay | Admitting: Family Medicine

## 2021-10-22 VITALS — BP 130/88 | HR 100 | Ht 66.0 in | Wt 128.0 lb

## 2021-10-22 DIAGNOSIS — Z87891 Personal history of nicotine dependence: Secondary | ICD-10-CM | POA: Diagnosis not present

## 2021-10-22 DIAGNOSIS — F339 Major depressive disorder, recurrent, unspecified: Secondary | ICD-10-CM | POA: Diagnosis not present

## 2021-10-22 DIAGNOSIS — R0600 Dyspnea, unspecified: Secondary | ICD-10-CM | POA: Diagnosis not present

## 2021-10-22 DIAGNOSIS — R0602 Shortness of breath: Secondary | ICD-10-CM | POA: Diagnosis not present

## 2021-10-22 DIAGNOSIS — J209 Acute bronchitis, unspecified: Secondary | ICD-10-CM | POA: Diagnosis not present

## 2021-10-22 DIAGNOSIS — N183 Chronic kidney disease, stage 3 unspecified: Secondary | ICD-10-CM | POA: Diagnosis not present

## 2021-10-22 DIAGNOSIS — J441 Chronic obstructive pulmonary disease with (acute) exacerbation: Secondary | ICD-10-CM | POA: Diagnosis not present

## 2021-10-22 DIAGNOSIS — R918 Other nonspecific abnormal finding of lung field: Secondary | ICD-10-CM | POA: Diagnosis not present

## 2021-10-22 DIAGNOSIS — U071 COVID-19: Secondary | ICD-10-CM | POA: Diagnosis not present

## 2021-10-22 DIAGNOSIS — J440 Chronic obstructive pulmonary disease with acute lower respiratory infection: Secondary | ICD-10-CM | POA: Diagnosis not present

## 2021-10-22 DIAGNOSIS — Z880 Allergy status to penicillin: Secondary | ICD-10-CM | POA: Diagnosis not present

## 2021-10-22 LAB — POC COVID19 BINAXNOW

## 2021-10-22 MED ORDER — SERTRALINE HCL 100 MG PO TABS: ORAL_TABLET | ORAL | 1 refills | Status: DC

## 2021-10-22 NOTE — Progress Notes (Signed)
Date:  10/22/2021   Name:  Marie Martin   DOB:  March 03, 1931   MRN:  [id number]   Chief Complaint: Cough [place] production started x 1 week) and Depression  Cough This is a chronic problem. The current episode started 1 to 4 weeks ago. The problem has been gradually improving. The cough is Productive of purulent sputum. Associated symptoms include shortness of breath and wheezing. Pertinent negatives include no chills, fever, headaches, myalgias, rash or sore throat. She has tried a beta-agonist inhaler for the symptoms. Her past medical history is significant for COPD. There is no history of environmental allergies.  Depression        This is a chronic (flare started last week) problem.  The problem has been waxing and waning since onset.  Associated symptoms include no myalgias, no headaches and no suicidal ideas.  Past treatments include SSRIs - Selective serotonin reuptake inhibitors.  Compliance with treatment is good. Shortness of Breath This is a chronic problem. The current episode started in the past 7 days. The problem has been waxing and waning. Associated symptoms include wheezing. Pertinent negatives include no fever, headaches, neck pain, rash or sore throat. She has tried beta agonist inhalers, steroid inhalers and leukotriene antagonists for the symptoms. The treatment provided mild relief. Her past medical history is significant for COPD.    Lab Results  Component Value Date   NA 142 07/11/2019   K 4.8 07/11/2019   CO2 23 07/11/2019   GLUCOSE 89 07/11/2019   BUN 22 07/11/2019   CREATININE 1.27 (H) 07/11/2019   CALCIUM 8.9 07/11/2019   GFRNONAA 38 (L) 07/11/2019   Lab Results  Component Value Date   CHOL 255 (H) 07/11/2019   HDL 61 07/11/2019   LDLCALC 169 (H) 07/11/2019   TRIG 141 07/11/2019   CHOLHDL 3.7 12/02/2015   No results found for: "TSH" No results found for: "HGBA1C" Lab Results  Component Value Date   WBC 8.2 06/10/2021   HGB 12.3 06/10/2021    HCT 36.9 06/10/2021   MCV 93 06/10/2021   PLT 249 06/10/2021   No results found for: "ALT", "AST", "GGT", "ALKPHOS", "BILITOT" No results found for: "25OHVITD2", "25OHVITD3", "VD25OH"   Review of Systems  Constitutional:  Negative for chills and fever.  HENT:  Negative for sore throat.   Respiratory:  Positive for cough, shortness of breath and wheezing.   Gastrointestinal:  Negative for nausea.  Endocrine: Negative for polydipsia.  Genitourinary:  Negative for dysuria, frequency, hematuria and urgency.  Musculoskeletal:  Negative for myalgias and neck pain.  Skin:  Negative for rash.  Allergic/Immunologic: Negative for environmental allergies.  Neurological:  Negative for dizziness and headaches.  Hematological:  Does not bruise/bleed easily.  Psychiatric/Behavioral:  Positive for depression. Negative for suicidal ideas. The patient is not nervous/anxious.     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  Current Meds  Medication Sig   albuterol (PROVENTIL) (2.5 MG/3ML) 0.083% nebulizer solution USE 1 VIAL IN NEBULIZER EVERY 6 HOURS AS NEEDED FOR WHEEZING FOR SHORTNESS OF BREATH   albuterol (VENTOLIN HFA) 108 (90 Base) MCG/ACT inhaler INHALE 2  PUFFS BY MOUTH EVERY 6 HOURS AS NEEDED FOR WHEEZING FOR SHORTNESS OF BREATH   Ascorbic Acid (VITAMIN C WITH ROSE HIPS) 1000 MG tablet Take 1,000 mg by mouth daily.   Fluticasone-Umeclidin-Vilant (TRELEGY ELLIPTA) 100-62.5-25 MCG/ACT AEPB INHALE 1 PUFF ONCE DAILY   Garlic 100 MG TABS Take by mouth.   Iron, Ferrous Sulfate, 325 (65 Fe) MG TABS Take 325 mg by mouth daily.   Multiple Vitamins-Minerals (MULTIVITAMIN WOMENS 50+ ADV) TABS Take by mouth.   predniSONE (DELTASONE) 10 MG tablet Take 1 tablet (10 mg total) by mouth daily with breakfast.   sertraline (ZOLOFT) 100 MG tablet TAKE 1 TABLET BY MOUTH DAILY. GENERIC EQUIVALENT FOR ZOLOFT   vitamin B-12 (CYANOCOBALAMIN) 1000 MCG tablet Take 1,000 mcg by mouth daily.   vitamin E 180 MG (400 UNITS) capsule Take 400 Units by mouth daily.   Current Facility-Administered Medications for the 10/22/21 encounter (Office Visit) with Duanne Limerick, MD  Medication   ipratropium-albuterol (DUONEB) 0.5-2.5 (3) MG/3ML nebulizer solution 3 mL       06/10/2021    8:39 AM 12/13/2020    1:24 PM 10/29/2020   10:57 AM 07/04/2020    1:16 PM  GAD 7 : Generalized Anxiety Score  Nervous, Anxious, on Edge 0 0 0 0  Control/stop worrying 0 0 0 0  Worry too much - different things 0 0 0 0  Trouble relaxing 0 0 0 0  Restless 0 0 0 0  Easily annoyed or irritable 0 0 0 0  Afraid - awful might happen 0 0 0 0  Total GAD 7 Score 0 0 0 0  Anxiety Difficulty Not difficult at all Not difficult at all         10/22/2021   11:19 AM 06/10/2021    8:38 AM 12/13/2020    1:24 PM  Depression screen PHQ 2/9  Decreased Interest 0 0 0  Down, Depressed, Hopeless 0 0 0  PHQ - 2 Score 0 0 0  Altered sleeping 0 0 0  Tired, decreased energy 0 0 1  Change in appetite 0 0 0  Feeling bad or failure about yourself  0 0 0  Trouble concentrating 0 0 0  Moving slowly or fidgety/restless 0 0 0  Suicidal thoughts 0 0 0  PHQ-9 Score 0 0 1  Difficult doing work/chores Not difficult at all  Not difficult at all Not difficult at all    BP Readings from Last 3 Encounters:  10/22/21 130/88  06/10/21 120/76  01/14/21 124/70    Physical Exam Vitals and nursing note reviewed.  Constitutional:      Appearance: She is well-developed.  HENT:     Head: Normocephalic.     Right Ear: External ear normal.     Left Ear: External ear normal.  Eyes:     General: Lids are everted, no foreign bodies appreciated. No scleral icterus.       Left eye: No foreign body or hordeolum.     Conjunctiva/sclera: Conjunctivae normal.  Right eye: Right conjunctiva is not injected.     Left eye: Left conjunctiva is not injected.     Pupils: Pupils are equal, round, and reactive to light.  Neck:     Thyroid: No thyromegaly.     Vascular: No JVD.     Trachea: No tracheal deviation.  Cardiovascular:     Rate and Rhythm: Normal rate and regular rhythm.     Heart sounds: Normal heart sounds. No murmur heard.    No friction rub. No gallop.  Pulmonary:     Effort: Pulmonary effort is normal. No respiratory distress.     Breath sounds: Examination of the left-upper field reveals wheezing. Examination of the right-middle field reveals wheezing. Examination of the left-middle field reveals wheezing. Examination of the right-lower field reveals wheezing. Examination of the left-lower field reveals decreased breath sounds and wheezing. Decreased breath sounds and wheezing present. No rales.  Abdominal:     General: Bowel sounds are normal.     Palpations: Abdomen is soft. There is no mass.     Tenderness: There is no abdominal tenderness. There is no guarding or rebound.  Musculoskeletal:        General: No tenderness. Normal range of motion.     Cervical back: Normal range of motion and neck supple.  Lymphadenopathy:     Cervical: No cervical adenopathy.  Skin:    General: Skin is warm.     Findings: No rash.  Neurological:     Mental Status: She is alert and oriented to person, place, and time.      Cranial Nerves: No cranial nerve deficit.     Deep Tendon Reflexes: Reflexes normal.  Psychiatric:        Mood and Affect: Mood is not anxious or depressed.     Wt Readings from Last 3 Encounters:  10/22/21 128 lb (58.1 kg)  06/10/21 122 lb (55.3 kg)  01/14/21 130 lb (59 kg)    BP 130/88   Pulse 100   Ht 5\' 6"  (1.676 m)   Wt 128 lb (58.1 kg)   SpO2 92%   BMI 20.66 kg/m   Assessment and Plan: 1. COVID New onset.  With a exacerbation.  Patient's baseline on evaluation was 79% off of oxygen.  Patient does not wear oxygen during the day but only at night.  Patient has continued to use her albuterol inhaler along with her Trelegy.  I am concerned about her baseline of 79 may represent an underlying pneumonia and with her positive COVID today we are sending to an emergency setting for x-rays and likely him further evaluation and may be nebulization. - POC COVID-19  2. COPD (chronic obstructive pulmonary disease) with acute bronchitis (HCC) Chronic.  Persistent.  Patient may be at baseline but concerning that she presented to the clinic in shortness of breath and a pulse ox of 79 off of oxygen.  On discussion it was noted that she does not use her oxygen during the day and only at night.  I have concerns that this may be a worsening her underlying condition and with the over light of COVID patient is at risk of worsening. - Ambulatory referral to Pulmonology - POC COVID-19  3. Recurrent major depressive episodes (HCC) Chronic.  Controlled.  Stable.  PHQ is 0 Gad score  we will continue Zoloft 100 mg once a day. - sertraline (ZOLOFT) 100 MG tablet; TAKE 1 TABLET BY MOUTH DAILY. GENERIC EQUIVALENT FOR ZOLOFT  Dispense: 90 tablet; Refill:  1     Elizabeth Sauer, MD

## 2021-10-27 ENCOUNTER — Telehealth: Payer: Self-pay

## 2021-10-27 ENCOUNTER — Telehealth: Payer: Self-pay | Admitting: Family Medicine

## 2021-10-27 NOTE — Telephone Encounter (Signed)
Spoke to pt concerning negative COVID test. She is "feeling much better since hospital visit."

## 2021-10-27 NOTE — Telephone Encounter (Signed)
Copied from [place] [id number]. Topic: General - Inquiry >> Oct 27, 2021  2:37 PM Teressa P wrote: Reason for CRM: Pt has tested neg for covid and she is feeling much better.  She ask for Baxter Flattery to call her  CB@  [phone]

## 2021-10-28 ENCOUNTER — Telehealth: Payer: Self-pay

## 2021-10-28 ENCOUNTER — Telehealth: Payer: Self-pay | Admitting: Family Medicine

## 2021-10-28 NOTE — Telephone Encounter (Signed)
Filled in immunizations

## 2021-10-28 NOTE — Telephone Encounter (Signed)
Copied from [place] [id number]. Topic: General - Inquiry >> Oct 28, 2021  8:54 AM Devoria Glassing wrote: Reason for CRM: pt  would like Baxter Flattery  to call her.  She states she has some information for her.

## 2021-11-20 DIAGNOSIS — J449 Chronic obstructive pulmonary disease, unspecified: Secondary | ICD-10-CM | POA: Diagnosis not present

## 2021-12-17 DIAGNOSIS — R300 Dysuria: Secondary | ICD-10-CM | POA: Diagnosis not present

## 2021-12-17 DIAGNOSIS — N390 Urinary tract infection, site not specified: Secondary | ICD-10-CM | POA: Diagnosis not present

## 2021-12-31 ENCOUNTER — Ambulatory Visit (INDEPENDENT_AMBULATORY_CARE_PROVIDER_SITE_OTHER): Payer: Medicare Other | Admitting: Family Medicine

## 2021-12-31 ENCOUNTER — Encounter: Payer: Self-pay | Admitting: Family Medicine

## 2021-12-31 VITALS — BP 110/64 | HR 68 | Ht 66.0 in | Wt 130.0 lb

## 2021-12-31 DIAGNOSIS — R350 Frequency of micturition: Secondary | ICD-10-CM

## 2021-12-31 DIAGNOSIS — N3090 Cystitis, unspecified without hematuria: Secondary | ICD-10-CM | POA: Diagnosis not present

## 2021-12-31 LAB — POCT URINALYSIS DIPSTICK
Bilirubin, UA: NEGATIVE
Glucose, UA: NEGATIVE
Ketones, UA: NEGATIVE
Nitrite, UA: NEGATIVE
Protein, UA: POSITIVE — AB
Spec Grav, UA: 1.01 (ref 1.010–1.025)
Urobilinogen, UA: 0.2 E.U./dL
pH, UA: 7 (ref 5.0–8.0)

## 2021-12-31 MED ORDER — NITROFURANTOIN MONOHYD MACRO 100 MG PO CAPS: 100.0000 mg | ORAL_CAPSULE | Freq: Two times a day (BID) | ORAL | 0 refills | Status: DC

## 2021-12-31 NOTE — Progress Notes (Unsigned)
Date:  12/31/2021   Name:  Marie Martin   DOB:  1931-11-27   MRN:  [id number]   Chief Complaint: Urinary Tract Infection (Went to urgent care on 11/8- was given 7 days of cefdinir- finished on 11/15- started 2 days later with light blood on pad and no other Sx.)  Urinary Tract Infection  This is a recurrent problem. The current episode started in the past 7 days. The patient is experiencing no pain. There has been no fever. Associated symptoms include frequency and hematuria. Pertinent negatives include no flank pain or urgency. Associated symptoms comments: nocturia. Treatments tried: cefdenir. The treatment provided moderate relief.    Lab Results  Component Value Date   NA 142 07/11/2019   K 4.8 07/11/2019   CO2 23 07/11/2019   GLUCOSE 89 07/11/2019   BUN 22 07/11/2019   CREATININE 1.27 (H) 07/11/2019   CALCIUM 8.9 07/11/2019   GFRNONAA 38 (L) 07/11/2019   Lab Results  Component Value Date   CHOL 255 (H) 07/11/2019   HDL 61 07/11/2019   LDLCALC 169 (H) 07/11/2019   TRIG 141 07/11/2019   CHOLHDL 3.7 12/02/2015   No results found for: "TSH" No results found for: "HGBA1C" Lab Results  Component Value Date   WBC 8.2 06/10/2021   HGB 12.3 06/10/2021   HCT 36.9 06/10/2021   MCV 93 06/10/2021   PLT 249 06/10/2021   No results found for: "ALT", "AST", "GGT", "ALKPHOS", "BILITOT" No results found for: "25OHVITD2", "25OHVITD3", "VD25OH"   Review of Systems  Genitourinary:  Positive for frequency and hematuria. Negative for difficulty urinating, dysuria, flank pain, pelvic pain and urgency.    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  No outpatient medications have been marked as taking for the 12/31/21 encounter (Office Visit) with Duanne Limerick, MD.   Current Facility-Administered Medications for the 12/31/21 encounter (Office Visit) with Duanne Limerick, MD  Medication   ipratropium-albuterol (DUONEB) 0.5-2.5 (3) MG/3ML nebulizer solution 3 mL       12/31/2021    9:07 AM 06/10/2021    8:39 AM 12/13/2020    1:24 PM 10/29/2020   10:57 AM  GAD 7 : Generalized Anxiety Score  Nervous, Anxious, on Edge 0 0 0 0  Control/stop worrying 0 0 0 0  Worry too much - different things 0 0 0 0  Trouble relaxing 0 0 0 0  Restless 0 0 0 0  Easily annoyed or irritable 0 0 0 0  Afraid - awful might happen 0 0 0 0  Total GAD 7 Score 0 0 0 0  Anxiety Difficulty Not difficult at all Not difficult at  all Not difficult at all        12/31/2021    9:07 AM 10/22/2021   11:19 AM 06/10/2021    8:38 AM  Depression screen PHQ 2/9  Decreased Interest 0 0 0  Down, Depressed, Hopeless 0 0 0  PHQ - 2 Score 0 0 0  Altered sleeping 0 0 0  Tired, decreased energy 0 0 0  Change in appetite 0 0 0  Feeling bad or failure about yourself  0 0 0  Trouble concentrating 0 0 0  Moving slowly or fidgety/restless 0 0 0  Suicidal thoughts 0 0 0  PHQ-9 Score 0 0 0  Difficult doing work/chores Not difficult at all Not difficult at all Not difficult at all    BP Readings from Last 3 Encounters:  12/31/21 110/64  10/22/21 130/88  06/10/21 120/76    Physical Exam Constitutional:      Appearance: Normal appearance.  HENT:     Right Ear:  Tympanic membrane normal.     Left Ear: Tympanic membrane normal.     Nose: Nose normal.     Mouth/Throat:     Mouth: Mucous membranes are moist.  Cardiovascular:     Rate and Rhythm: Normal rate and regular rhythm.     Heart sounds: No murmur heard.    No gallop.  Pulmonary:     Breath sounds: No wheezing, rhonchi or rales.  Abdominal:     Palpations: There is no hepatomegaly or splenomegaly.     Tenderness: There is no abdominal tenderness. There is no guarding or rebound.  Musculoskeletal:     Cervical back: Neck supple.     Wt Readings from Last 3 Encounters:  12/31/21 130 lb (59 kg)  10/22/21 128 lb (58.1 kg)  06/10/21 122 lb (55.3 kg)    BP 110/64   Pulse 68   Ht 5\' 6"  (1.676 m)   Wt 130 lb (59 kg)   SpO2 (!) 88% Comment: had nail polish on  BMI 20.98 kg/m   Assessment and Plan:  1. Urine frequency Patient continues to have urinary frequency without dysuria.  Previously was noted to have gross hematuria and was treated for urinary tract infection with decrease in dysuria however on urinalysis it was noted that patient still has leukocytes present and we will send urine for culture and address with different antibiotic. - POCT urinalysis dipstick - Urine Culture  2. Recurrent cystitis Urinalysis still remains abnormal with leukocytes present.  There has been no further hematuria however.  We will switch over to nitrofurantoin 100 mg twice a day for 7 days and send urine for culture for sensitivities and identification. - nitrofurantoin, macrocrystal-monohydrate, (MACROBID) 100 MG capsule; Take 1 capsule (100 mg total) by mouth 2 (two) times daily for 7 days.  Dispense: 14 capsule; Refill: 0    Otilio Miu, MD

## 2022-01-01 ENCOUNTER — Encounter: Payer: Self-pay | Admitting: Family Medicine

## 2022-01-04 LAB — URINE CULTURE

## 2022-01-05 ENCOUNTER — Other Ambulatory Visit: Payer: Self-pay

## 2022-01-05 DIAGNOSIS — N3090 Cystitis, unspecified without hematuria: Secondary | ICD-10-CM

## 2022-01-05 MED ORDER — CIPROFLOXACIN HCL 250 MG PO TABS: 250.0000 mg | ORAL_TABLET | Freq: Two times a day (BID) | ORAL | 0 refills | Status: AC

## 2022-01-05 NOTE — Progress Notes (Signed)
Sent in Cipro to [facility]

## 2022-01-26 ENCOUNTER — Ambulatory Visit (INDEPENDENT_AMBULATORY_CARE_PROVIDER_SITE_OTHER): Payer: Medicare Other | Admitting: Family Medicine

## 2022-01-26 VITALS — BP 124/78 | HR 76 | Ht 66.0 in | Wt 132.0 lb

## 2022-01-26 DIAGNOSIS — R051 Acute cough: Secondary | ICD-10-CM | POA: Diagnosis not present

## 2022-01-26 DIAGNOSIS — J209 Acute bronchitis, unspecified: Secondary | ICD-10-CM

## 2022-01-26 DIAGNOSIS — J440 Chronic obstructive pulmonary disease with acute lower respiratory infection: Secondary | ICD-10-CM | POA: Diagnosis not present

## 2022-01-26 DIAGNOSIS — U071 COVID-19: Secondary | ICD-10-CM | POA: Diagnosis not present

## 2022-01-26 LAB — POC COVID19 BINAXNOW: SARS Coronavirus 2 Ag: POSITIVE — AB

## 2022-01-26 MED ORDER — PREDNISONE 10 MG PO TABS: 10.0000 mg | ORAL_TABLET | Freq: Every day | ORAL | 0 refills | Status: DC

## 2022-01-26 MED ORDER — MOLNUPIRAVIR EUA 200MG CAPSULE: 4.0000 | ORAL_CAPSULE | Freq: Two times a day (BID) | ORAL | 0 refills | Status: AC

## 2022-01-26 NOTE — Progress Notes (Signed)
Date:  01/26/2022   Name:  Marie Martin   DOB:  [birth year]   MRN:  [id number]   Chief Complaint: Cough (Cough with mucous x2 days. No Covid Test. Green and Yellow mucous. No fever. SOB but this is normal for patient. Pt is on oxygen)  Cough This is a recurrent problem. The current episode started in the past 7 days. The problem has been waxing and waning. The problem occurs constantly. The cough is Non-productive. Associated symptoms include nasal congestion, postnasal drip, rhinorrhea and wheezing. Pertinent negatives include no chest pain, chills, ear congestion, fever or sore throat. She has tried a beta-agonist inhaler, steroid inhaler and leukotriene antagonists for the symptoms. The treatment provided moderate relief.    Lab Results  Component Value Date   NA 142 07/11/2019   K 4.8 07/11/2019   CO2 23 07/11/2019   GLUCOSE 89 07/11/2019   BUN 22 07/11/2019   CREATININE 1.27 (H) 07/11/2019   CALCIUM 8.9 07/11/2019   GFRNONAA 38 (L) 07/11/2019   Lab Results  Component Value Date   CHOL 255 (H) 07/11/2019   HDL 61 07/11/2019   LDLCALC 169 (H) 07/11/2019   TRIG 141 07/11/2019   CHOLHDL 3.7 12/02/2015   No results found for: "TSH" No results found for: "HGBA1C" Lab Results  Component Value Date   WBC 8.2 06/10/2021   HGB 12.3 06/10/2021   HCT 36.9 06/10/2021   MCV 93 06/10/2021   PLT 249 06/10/2021   No results found for: "ALT", "AST", "GGT", "ALKPHOS", "BILITOT" No results found for: "25OHVITD2", "25OHVITD3", "VD25OH"   Review of Systems  Constitutional:  Negative for chills and fever.  HENT:  Positive for postnasal drip and rhinorrhea. Negative for sore throat.   Respiratory:  Positive for cough and wheezing.   Cardiovascular:  Negative for chest pain.    Patient Active Problem List   Diagnosis Date Noted   COPD exacerbation (HCC) 02/22/2017   Cough 02/22/2017   Depression 02/22/2017   SOB (shortness of breath) 02/22/2017   Centriacinar emphysema (HCC)  05/31/2014   Recurrent major depressive episodes (HCC) 05/31/2014    Allergies  Allergen Reactions   Amoxicillin-Pot Clavulanate Nausea Only and Other (See Comments)    Other Reaction: trouble swallowing   Penicillins     Past Surgical History:  Procedure Laterality Date   TONSILLECTOMY      Social History   Tobacco Use   Smoking status: Former   Smokeless tobacco: Never  Vaping Use   Vaping Use: Never used  Substance Use Topics   Alcohol use: No    Alcohol/week: 0.0 standard drinks of alcohol   Drug use: No     Medication list has been reviewed and updated.  Current Meds  Medication Sig   albuterol (PROVENTIL) (2.5 MG/3ML) 0.083% nebulizer solution USE 1 VIAL IN NEBULIZER EVERY 6 HOURS AS NEEDED FOR WHEEZING FOR SHORTNESS OF BREATH   albuterol (VENTOLIN HFA) 108 (90 Base) MCG/ACT inhaler INHALE 2 PUFFS BY MOUTH EVERY 6 HOURS AS NEEDED FOR WHEEZING FOR SHORTNESS OF BREATH   Ascorbic Acid (VITAMIN C WITH ROSE HIPS) 1000 MG tablet Take 1,000 mg by mouth daily.   Fluticasone-Umeclidin-Vilant (TRELEGY ELLIPTA) 100-62.5-25 MCG/ACT AEPB INHALE 1 PUFF ONCE DAILY   Garlic 100 MG TABS Take by mouth.   Iron, Ferrous Sulfate, 325 (65 Fe) MG TABS Take 325 mg by mouth daily.   Multiple Vitamins-Minerals (MULTIVITAMIN WOMENS 50+ ADV) TABS Take by mouth.   sertraline (ZOLOFT) 100 MG tablet  TAKE 1 TABLET BY MOUTH DAILY. GENERIC EQUIVALENT FOR ZOLOFT   vitamin B-12 (CYANOCOBALAMIN) 1000 MCG tablet Take 1,000 mcg by mouth daily.   vitamin E 180 MG (400 UNITS) capsule Take 400 Units by mouth daily.   [DISCONTINUED] predniSONE (DELTASONE) 10 MG tablet Take 1 tablet (10 mg total) by mouth daily with breakfast.   Current Facility-Administered Medications for the 01/26/22 encounter (Office Visit) with Duanne Limerick, MD  Medication   ipratropium-albuterol (DUONEB) 0.5-2.5 (3) MG/3ML nebulizer solution 3 mL       12/31/2021    9:07 AM 06/10/2021    8:39 AM 12/13/2020    1:24 PM 10/29/2020    10:57 AM  GAD 7 : Generalized Anxiety Score  Nervous, Anxious, on Edge 0 0 0 0  Control/stop worrying 0 0 0 0  Worry too much - different things 0 0 0 0  Trouble relaxing 0 0 0 0  Restless 0 0 0 0  Easily annoyed or irritable 0 0 0 0  Afraid - awful might happen 0 0 0 0  Total GAD 7 Score 0 0 0 0  Anxiety Difficulty Not difficult at all Not difficult at all Not difficult at all        12/31/2021    9:07 AM 10/22/2021   11:19 AM 06/10/2021    8:38 AM  Depression screen PHQ 2/9  Decreased Interest 0 0 0  Down, Depressed, Hopeless 0 0 0  PHQ - 2 Score 0 0 0  Altered sleeping 0 0 0  Tired, decreased energy 0 0 0  Change in appetite 0 0 0  Feeling bad or failure about yourself  0 0 0  Trouble concentrating 0 0 0  Moving slowly or fidgety/restless 0 0 0  Suicidal thoughts 0 0 0  PHQ-9 Score 0 0 0  Difficult doing work/chores Not difficult at all Not difficult at all Not difficult at all    BP Readings from Last 3 Encounters:  01/26/22 124/78  12/31/21 110/64  10/22/21 130/88    Physical Exam Vitals and nursing note reviewed. Exam conducted with a chaperone present.  Constitutional:      General: She is not in acute distress.    Appearance: She is not diaphoretic.  HENT:     Head: Normocephalic and atraumatic.     Right Ear: External ear normal.     Left Ear: External ear normal.     Nose: Nose normal.  Eyes:     General:        Right eye: No discharge.        Left eye: No discharge.     Conjunctiva/sclera: Conjunctivae normal.     Pupils: Pupils are equal, round, and reactive to light.  Neck:     Thyroid: No thyromegaly.     Vascular: No JVD.  Cardiovascular:     Rate and Rhythm: Normal rate and regular rhythm.     Heart sounds: Normal heart sounds. No murmur heard.    No friction rub. No gallop.  Pulmonary:     Effort: Pulmonary effort is normal.     Breath sounds: Normal breath sounds.  Abdominal:     General: Bowel sounds are normal.     Palpations:  Abdomen is soft. There is no mass.     Tenderness: There is no abdominal tenderness. There is no guarding.  Musculoskeletal:        General: Normal range of motion.     Cervical back: Normal range  of motion and neck supple.  Lymphadenopathy:     Cervical: No cervical adenopathy.  Skin:    General: Skin is warm and dry.  Neurological:     Mental Status: She is alert.     Wt Readings from Last 3 Encounters:  01/26/22 132 lb (59.9 kg)  12/31/21 130 lb (59 kg)  10/22/21 128 lb (58.1 kg)    BP 124/78   Pulse 76   Ht 5\' 6"  (1.676 m)   Wt 132 lb (59.9 kg)   SpO2 90%   BMI 21.31 kg/m   Assessment and Plan: 1. Acute cough Patient with new onset of acute cough which is similar to when she has had exacerbation of COPD but upon checking her COVID it was noted to be positive.  At this point in time we will be starting her on molnupiravir as noted below 200 mg 4 capsules twice a day as well as using Mucinex DM every 6 hours for cough. - POC COVID-19 BinaxNow - predniSONE (DELTASONE) 10 MG tablet; Take 1 tablet (10 mg total) by mouth daily with breakfast.  Dispense: 30 tablet; Refill: 0  2. COVID Onset.  Persistent.  Upon checking COVID it is positive at this time this is I think her second episode as I recall.  As noted above we will treat with molnupiravir for 5 days. - molnupiravir EUA (LAGEVRIO) 200 mg CAPS capsule; Take 4 capsules (800 mg total) by mouth 2 (two) times daily for 5 days.  Dispense: 40 capsule; Refill: 0  3. COPD (chronic obstructive pulmonary disease) with acute bronchitis (HCC) Virus has tipped over her COPD we will resume prednisone but not at a taper at 10 mg once a day for 2 weeks.  Patient has been instructed to use her Trelegy as well as her albuterol on a regular basis as well. - predniSONE (DELTASONE) 10 MG tablet; Take 1 tablet (10 mg total) by mouth daily with breakfast.  Dispense: 30 tablet; Refill: 0     , MD

## 2022-01-27 ENCOUNTER — Ambulatory Visit: Payer: Medicare Other | Admitting: Family Medicine

## 2022-01-30 ENCOUNTER — Telehealth: Payer: Self-pay | Admitting: Family Medicine

## 2022-01-30 NOTE — Telephone Encounter (Signed)
Called pt left VM to call back.  KP 

## 2022-01-30 NOTE — Telephone Encounter (Signed)
Copied from CRM [id number]. Topic: General - Other >> Jan 30, 2022 11:17 AM Everette C wrote: Reason for CRM: The patient's daughter has called to share that the patient would like to be contacted regarding additional prescriptions for a z pak  The patient's daughter has specifically requested contact with T. Lynch regarding the request on her cell phone at [phone]   Please contact further when possible

## 2022-02-03 ENCOUNTER — Other Ambulatory Visit: Payer: Self-pay

## 2022-02-03 ENCOUNTER — Telehealth: Payer: Self-pay

## 2022-02-03 DIAGNOSIS — R051 Acute cough: Secondary | ICD-10-CM

## 2022-02-03 MED ORDER — PROMETHAZINE-DM 6.25-15 MG/5ML PO SYRP: 5.0000 mL | ORAL_SOLUTION | Freq: Four times a day (QID) | ORAL | 0 refills | Status: DC | PRN

## 2022-02-03 MED ORDER — BENZONATATE 100 MG PO CAPS: 100.0000 mg | ORAL_CAPSULE | Freq: Two times a day (BID) | ORAL | 0 refills | Status: DC | PRN

## 2022-02-03 NOTE — Telephone Encounter (Addendum)
Caller returning call and requesting cough medicine with codeine at night.

## 2022-02-03 NOTE — Telephone Encounter (Signed)
Called pt and sent in prom- dextra syrup

## 2022-04-02 ENCOUNTER — Other Ambulatory Visit: Payer: Self-pay | Admitting: Family Medicine

## 2022-04-02 DIAGNOSIS — F339 Major depressive disorder, recurrent, unspecified: Secondary | ICD-10-CM

## 2022-04-02 DIAGNOSIS — J441 Chronic obstructive pulmonary disease with (acute) exacerbation: Secondary | ICD-10-CM

## 2022-04-02 MED ORDER — TRELEGY ELLIPTA 100-62.5-25 MCG/ACT IN AEPB: INHALATION_SPRAY | RESPIRATORY_TRACT | 0 refills | Status: DC

## 2022-04-02 MED ORDER — SERTRALINE HCL 100 MG PO TABS: ORAL_TABLET | ORAL | 0 refills | Status: DC

## 2022-04-02 NOTE — Telephone Encounter (Signed)
Requested Prescriptions  Pending Prescriptions Disp Refills   Fluticasone-Umeclidin-Vilant (TRELEGY ELLIPTA) 100-62.5-25 MCG/ACT AEPB 3 each 0    Sig: INHALE 1 PUFF ONCE DAILY     Off-Protocol Failed - 04/02/2022 12:53 PM      Failed - Medication not assigned to a protocol, review manually.      Passed - Valid encounter within last 12 months    Recent Outpatient Visits           2 months ago [place] [facility] [place], Deanna C, MD   3 months ago Urine frequency   [place] [facility] [place], Deanna C, MD   5 months ago [place] at [facility], MD   9 months ago Chronic obstructive pulmonary disease with acute exacerbation [facility])   [place] [facility] at [place], Deanna C, MD   1 year ago Chronic obstructive pulmonary disease with acute exacerbation ([place])   [place] [facility] at [facility], MD               sertraline (ZOLOFT) 100 MG tablet 90 tablet 0    Sig: TAKE 1 TABLET BY MOUTH DAILY. GENERIC EQUIVALENT FOR ZOLOFT     Psychiatry:  Antidepressants - SSRI - sertraline Failed - 04/02/2022 12:53 PM      Failed - AST in normal range and within 360 days    No results found for: "POCAST", "AST"       Failed - ALT in normal range and within 360 days    No results found for: "ALT", "LABALT", "POCALT"       Passed - Completed PHQ-2 or PHQ-9 in the last 360 days      Passed - Valid encounter within last 6 months    Recent Outpatient Visits           2 months ago [place] [facility] [place], Deanna C, MD   3 months ago Urine frequency   [place] [facility] at [place], Deanna C, MD   5 months ago [place] at  [place], Deanna C, MD   9 months ago Chronic obstructive pulmonary disease with acute exacerbation [facility])   [place] [facility] at [place], Deanna C, MD   1 year ago Chronic obstructive pulmonary disease with acute exacerbation [facility])   [place] [facility], MD

## 2022-04-02 NOTE — Telephone Encounter (Signed)
Medication Refill - Medication: Fluticasone-Umeclidin-Vilant (TRELEGY ELLIPTA) 100-62.5-25 MCG/ACT AEPB  sertraline (ZOLOFT) 100 MG tablet  Has the patient contacted their pharmacy? No.    Preferred Pharmacy (with phone number or street name):  [place], [place] Phone: [phone]  Fax: [phone]     Has the patient been seen for an appointment in the last year OR does the patient have an upcoming appointment? Yes.    Agent: Please be advised that RX refills may take up to 3 business days. We ask that you follow-up with your pharmacy.

## 2022-05-21 DIAGNOSIS — J449 Chronic obstructive pulmonary disease, unspecified: Secondary | ICD-10-CM | POA: Diagnosis not present

## 2022-08-19 ENCOUNTER — Other Ambulatory Visit: Payer: Self-pay | Admitting: Family Medicine

## 2022-08-19 DIAGNOSIS — J441 Chronic obstructive pulmonary disease with (acute) exacerbation: Secondary | ICD-10-CM

## 2022-08-19 NOTE — Telephone Encounter (Signed)
Medication Refill - Medication: Fluticasone-Umeclidin-Vilant (TRELEGY ELLIPTA) 100-62.5-25 MCG/ACT AEPB  Says she usually gets three refills.   Requesting a call back from Delice Bison    Has the patient contacted their pharmacy? Yes.   (Agent: If no, request that the patient contact the pharmacy for the refill. If patient does not wish to contact the pharmacy document the reason why and proceed with request.) (Agent: If yes, when and what did the pharmacy advise?)  Preferred Pharmacy (with phone number or street name):  [facility] [address], [place] - [address]  [address] [place] [address]  Phone: [phone] Fax: [phone]   Has the patient been seen for an appointment in the last year OR does the patient have an upcoming appointment? Yes.    Agent: Please be advised that RX refills may take up to 3 business days. We ask that you follow-up with your pharmacy.

## 2022-08-19 NOTE — Telephone Encounter (Signed)
Requested medication (s) are due for refill today: Yes  Requested medication (s) are on the active medication list: Yes  Last refill:  04/02/22 #3 0RF  Future visit scheduled: No  Notes to clinic:  Unable to refill per protocol, medication not assigned to the refill protocol.      Requested Prescriptions  Pending Prescriptions Disp Refills   Fluticasone-Umeclidin-Vilant (TRELEGY ELLIPTA) 100-62.5-25 MCG/ACT AEPB 3 each 0    Sig: INHALE 1 PUFF ONCE DAILY     Off-Protocol Failed - 08/19/2022  9:42 AM      Failed - Medication not assigned to a protocol, review manually.      Passed - Valid encounter within last 12 months    Recent Outpatient Visits           6 months ago COVID   [facility], MD   7 months ago Urine frequency   [place] [facility], MD   10 months ago COVID   [facility], MD   1 year ago Chronic obstructive pulmonary disease with acute exacerbation [facility])   [place] [facility], MD   1 year ago Chronic obstructive pulmonary disease with acute exacerbation [facility])   [place] [facility], MD

## 2022-08-21 ENCOUNTER — Telehealth: Payer: Self-pay | Admitting: Family Medicine

## 2022-08-21 ENCOUNTER — Telehealth: Payer: Self-pay

## 2022-08-21 NOTE — Telephone Encounter (Signed)
Patient called to f/u on the script for Fluticasone-Umeclidin-Vilant (TRELEGY ELLIPTA) 100-62.5-25 MCG/ACT AEPB. Advised it was still pending to give more time but she requested that Delice Bison call her back.

## 2022-08-21 NOTE — Telephone Encounter (Signed)
Called pt. After speaking to Dr Reita Cliche nurse- she sent in Trellegy for the pt- I let pt know

## 2022-10-01 ENCOUNTER — Telehealth: Payer: Self-pay | Admitting: Family Medicine

## 2022-10-01 DIAGNOSIS — H524 Presbyopia: Secondary | ICD-10-CM | POA: Diagnosis not present

## 2022-10-01 NOTE — Telephone Encounter (Signed)
Copied from CRM [id number]. Topic: Medicare AWV >> Oct 01, 2022 11:26 AM Payton Doughty wrote: Reason for CRM: Called 10/01/2022 to sched AWV - MAILBOX FULL  Verlee Rossetti; Care Guide Ambulatory Clinical Support [place] [facility] Direct Dial: [phone]

## 2022-10-11 DIAGNOSIS — H524 Presbyopia: Secondary | ICD-10-CM | POA: Diagnosis not present

## 2022-10-30 ENCOUNTER — Ambulatory Visit (INDEPENDENT_AMBULATORY_CARE_PROVIDER_SITE_OTHER): Payer: Medicare Other | Admitting: Family Medicine

## 2022-10-30 ENCOUNTER — Encounter: Payer: Self-pay | Admitting: Family Medicine

## 2022-10-30 VITALS — BP 122/78 | HR 53 | Ht 66.0 in | Wt 131.0 lb

## 2022-10-30 DIAGNOSIS — J209 Acute bronchitis, unspecified: Secondary | ICD-10-CM

## 2022-10-30 DIAGNOSIS — J440 Chronic obstructive pulmonary disease with acute lower respiratory infection: Secondary | ICD-10-CM | POA: Diagnosis not present

## 2022-10-30 DIAGNOSIS — R051 Acute cough: Secondary | ICD-10-CM | POA: Diagnosis not present

## 2022-10-30 DIAGNOSIS — Z23 Encounter for immunization: Secondary | ICD-10-CM | POA: Diagnosis not present

## 2022-10-30 MED ORDER — BENZONATATE 100 MG PO CAPS: 100.0000 mg | ORAL_CAPSULE | Freq: Three times a day (TID) | ORAL | 0 refills | Status: DC | PRN

## 2022-10-30 MED ORDER — AZITHROMYCIN 250 MG PO TABS: ORAL_TABLET | ORAL | 0 refills | Status: AC

## 2022-10-30 NOTE — Progress Notes (Signed)
Date:  10/30/2022   Name:  Marie Martin   DOB:  1931/12/15   MRN:  [id number]   Chief Complaint: scratchy throat, Cough (Yellow production), and COPD  Cough This is a new problem. The current episode started in the past 7 days. The problem has been gradually worsening. The cough is Productive of blood-tinged sputum. Pertinent negatives include no chest pain, chills, ear congestion, ear pain, fever, headaches, heartburn, hemoptysis, myalgias, nasal congestion, postnasal drip, rash, rhinorrhea, sore throat, shortness of breath, sweats, weight loss or wheezing. Nothing aggravates the symptoms. Her past medical history is significant for COPD and emphysema. There is no history of asthma.  COPD She complains of cough, difficulty breathing and sputum production. There is no chest tightness, frequent throat clearing, hemoptysis, hoarse voice, shortness of breath or wheezing. This is a chronic problem. The problem occurs intermittently. The problem has been gradually improving. The cough is productive of sputum. Pertinent negatives include no chest pain, ear congestion, ear pain, fever, headaches, heartburn, myalgias, nasal congestion, postnasal drip, rhinorrhea, sore throat, sweats or weight loss. Her past medical history is significant for COPD and emphysema. There is no history of asthma.    Lab Results  Component Value Date   NA 142 07/11/2019   K 4.8 07/11/2019   CO2 23 07/11/2019   GLUCOSE 89 07/11/2019   BUN 22 07/11/2019   CREATININE 1.27 (H) 07/11/2019   CALCIUM 8.9 07/11/2019   GFRNONAA 38 (L) 07/11/2019   Lab Results  Component Value Date   CHOL 255 (H) 07/11/2019   HDL 61 07/11/2019   LDLCALC 169 (H) 07/11/2019   TRIG 141 07/11/2019   CHOLHDL 3.7 12/02/2015   No results found for: "TSH" No results found for: "HGBA1C" Lab Results  Component Value Date   WBC 8.2 06/10/2021   HGB 12.3 06/10/2021   HCT 36.9 06/10/2021   MCV 93 06/10/2021   PLT 249 06/10/2021   No  results found for: "ALT", "AST", "GGT", "ALKPHOS", "BILITOT" No results found for: "25OHVITD2", "25OHVITD3", "VD25OH"   Review of Systems  Constitutional:  Negative for chills, fever and weight loss.  HENT:  Negative for ear pain, hoarse voice, postnasal drip, rhinorrhea and sore throat.   Eyes:  Negative for visual disturbance.  Respiratory:  Positive for cough and sputum production. Negative for hemoptysis, chest tightness, shortness of breath and wheezing.   Cardiovascular:  Negative for chest pain, palpitations and leg swelling.  Gastrointestinal:  Negative for heartburn.  Endocrine: Negative for polydipsia.  Musculoskeletal:  Negative for myalgias.  Skin:  Negative for rash.  Neurological:  Negative for headaches.    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  Current Meds  Medication Sig   albuterol (PROVENTIL) (2.5 MG/3ML) 0.083% nebulizer solution USE 1 VIAL IN NEBULIZER EVERY 6 HOURS AS NEEDED FOR WHEEZING FOR SHORTNESS OF BREATH   albuterol (VENTOLIN HFA) 108 (90 Base) MCG/ACT inhaler INHALE 2 PUFFS BY MOUTH EVERY 6 HOURS AS NEEDED FOR WHEEZING FOR SHORTNESS OF BREATH   Ascorbic Acid (VITAMIN C WITH ROSE HIPS) 1000 MG tablet Take 1,000  mg by mouth daily.   Fluticasone-Umeclidin-Vilant (TRELEGY ELLIPTA) 100-62.5-25 MCG/ACT AEPB INHALE 1 PUFF ONCE DAILY   Garlic 100 MG TABS Take by mouth.   Iron, Ferrous Sulfate, 325 (65 Fe) MG TABS Take 325 mg by mouth daily.   Multiple Vitamins-Minerals (MULTIVITAMIN WOMENS 50+ ADV) TABS Take by mouth.   sertraline (ZOLOFT) 100 MG tablet TAKE 1 TABLET BY MOUTH DAILY. GENERIC EQUIVALENT FOR ZOLOFT   vitamin B-12 (CYANOCOBALAMIN) 1000 MCG tablet Take 1,000 mcg by mouth daily.   vitamin E 180 MG (400 UNITS) capsule Take 400 Units by mouth daily.   Current Facility-Administered Medications for the 10/30/22 encounter (Office Visit) with Duanne Limerick, MD  Medication   ipratropium-albuterol (DUONEB) 0.5-2.5 (3) MG/3ML nebulizer solution 3 mL       10/30/2022    8:50 AM 12/31/2021    9:07 AM 06/10/2021    8:39 AM 12/13/2020    1:24 PM  GAD 7 : Generalized Anxiety Score  Nervous, Anxious, on Edge 0 0 0 0  Control/stop worrying 0 0 0 0  Worry too much - different things 0 0 0 0  Trouble relaxing 0 0 0 0  Restless 0 0 0 0  Easily annoyed or irritable 0 0 0 0  Afraid - awful might happen 0 0 0 0  Total GAD 7 Score 0 0 0 0  Anxiety Difficulty Not difficult at all Not difficult at all Not difficult at all Not difficult at all       10/30/2022    8:50 AM 12/31/2021    9:07 AM 10/22/2021   11:19 AM  Depression screen PHQ 2/9  Decreased Interest 0 0 0  Down, Depressed, Hopeless 0 0 0  PHQ - 2 Score 0 0 0  Altered sleeping 0 0 0  Tired, decreased energy 0 0 0  Change in appetite 0 0 0  Feeling bad or failure about yourself  0 0 0  Trouble concentrating 0 0 0  Moving slowly or fidgety/restless 0 0 0  Suicidal thoughts 0 0 0  PHQ-9 Score 0 0 0  Difficult doing work/chores Not difficult at all Not difficult at all Not difficult at all    BP Readings from Last 3 Encounters:  10/30/22 122/78  01/26/22 124/78  12/31/21 110/64    Physical Exam Vitals and nursing note reviewed. Exam  conducted with a chaperone present.  Constitutional:      General: She is not in acute distress.    Appearance: She is not diaphoretic.  HENT:     Head: Normocephalic and atraumatic.     Right Ear: External ear normal.     Left Ear: External ear normal.     Nose: Nose normal.     Mouth/Throat:     Mouth: Mucous membranes are moist.  Eyes:     General:        Right eye: No discharge.        Left eye: No discharge.     Conjunctiva/sclera: Conjunctivae normal.     Pupils: Pupils are equal, round, and  reactive to light.  Neck:     Thyroid: No thyromegaly.     Vascular: No JVD.  Cardiovascular:     Rate and Rhythm: Normal rate and regular rhythm.     Heart sounds: Normal heart sounds. No murmur heard.    No friction rub. No gallop.  Pulmonary:     Effort: Pulmonary effort is normal.     Breath sounds: Normal breath sounds. No wheezing, rhonchi or rales.  Abdominal:     General: Bowel sounds are normal.     Palpations: Abdomen is soft. There is no mass.     Tenderness: There is no abdominal tenderness. There is no guarding.  Musculoskeletal:        General: Normal range of motion.     Cervical back: Normal range of motion and neck supple.  Lymphadenopathy:     Cervical: No cervical adenopathy.  Skin:    General: Skin is warm and dry.  Neurological:     Mental Status: She is alert.     Deep Tendon Reflexes: Reflexes are normal and symmetric.     Wt Readings from Last 3 Encounters:  10/30/22 131 lb (59.4 kg)  01/26/22 132 lb (59.9 kg)  12/31/21 130 lb (59 kg)    BP 122/78   Pulse (!) 53   Ht 5\' 6"  (1.676 m)   Wt 131 lb (59.4 kg)   SpO2 96%   BMI 21.14 kg/m   Assessment and Plan: 1. Acute cough New onset.  Consistent with this time a year when she starts having a productive cough associated with her COPD which develops into a lower respiratory infection usually pneumonia.  We will cover with azithromycin to 50 mg 2 today followed by 1 a day for 4 days and Tessalon  Perles for symptomatic control. - azithromycin (ZITHROMAX) 250 MG tablet; Take 2 tablets on day 1, then 1 tablet daily on days 2 through 5  Dispense: 6 tablet; Refill: 0 - benzonatate (TESSALON PERLES) 100 MG capsule; Take 1 capsule (100 mg total) by mouth 3 (three) times daily as needed for cough.  Dispense: 20 capsule; Refill: 0  2. COPD (chronic obstructive pulmonary disease) with acute bronchitis (HCC) Chronic.  Controlled.  Followed by pulmonary/Dr. Meredeth Ide.  Patient has been checked to make sure she has plenty of albuterol and has been instructed to continue her Trelegy during the change in weather season.  3. Flu vaccine need Discussed and administered - Flu Vaccine Trivalent High Dose (Fluad)     Elizabeth Sauer, MD

## 2022-11-20 MED ORDER — TRELEGY ELLIPTA 100-62.5-25 MCG/ACT IN AEPB: INHALATION_SPRAY | RESPIRATORY_TRACT | 0 refills | Status: AC

## 2022-12-07 DIAGNOSIS — J449 Chronic obstructive pulmonary disease, unspecified: Secondary | ICD-10-CM | POA: Diagnosis not present

## 2022-12-07 DIAGNOSIS — Z9981 Dependence on supplemental oxygen: Secondary | ICD-10-CM | POA: Diagnosis not present

## 2023-01-04 ENCOUNTER — Other Ambulatory Visit: Payer: Self-pay | Admitting: Family Medicine

## 2023-01-04 DIAGNOSIS — F339 Major depressive disorder, recurrent, unspecified: Secondary | ICD-10-CM

## 2023-01-04 NOTE — Telephone Encounter (Signed)
Medication Refill -  Most Recent Primary Care Visit:  Provider: Duanne Limerick  Department: [facility]  Visit Type: OFFICE VISIT  Date: 10/30/2022  Medication: sertraline (ZOLOFT) 100 MG tablet   Has the patient contacted their pharmacy? No  Is this the correct pharmacy for this prescription? Yes  This is the patient's preferred pharmacy: [facility] [address] [place] - [address] [place] [address] Phone: [phone] Fax: [phone]  Has the prescription been filled recently? Yes  Is the patient out of the medication? Yes  Has the patient been seen for an appointment in the last year OR does the patient have an upcoming appointment? Yes  Can we respond through MyChart? No  Agent: Please be advised that Rx refills may take up to 3 business days. We ask that you follow-up with your pharmacy.

## 2023-01-05 MED ORDER — SERTRALINE HCL 100 MG PO TABS: ORAL_TABLET | ORAL | 0 refills | Status: DC

## 2023-01-05 NOTE — Telephone Encounter (Signed)
Patient requesting refills. No future visit. Protocols met.  Requested Prescriptions  Pending Prescriptions Disp Refills   sertraline (ZOLOFT) 100 MG tablet 90 tablet 0    Sig: TAKE 1 TABLET BY MOUTH DAILY. GENERIC EQUIVALENT FOR ZOLOFT     Psychiatry:  Antidepressants - SSRI - sertraline Failed - 01/04/2023  1:14 PM      Failed - AST in normal range and within 360 days    No results found for: "POCAST", "AST"       Failed - ALT in normal range and within 360 days    No results found for: "ALT", "LABALT", "POCALT"       Passed - Completed PHQ-2 or PHQ-9 in the last 360 days      Passed - Valid encounter within last 6 months    Recent Outpatient Visits           2 months ago Acute cough   [place] [facility]   11 months ago COVID   [facility], MD   1 year ago Urine frequency   [place] [facility], MD   1 year ago COVID   [facility], MD   1 year ago Chronic obstructive pulmonary disease with acute exacerbation [facility])   [place] [facility], MD

## 2023-02-10 DIAGNOSIS — J449 Chronic obstructive pulmonary disease, unspecified: Secondary | ICD-10-CM | POA: Diagnosis not present

## 2023-03-11 ENCOUNTER — Ambulatory Visit: Payer: Self-pay | Admitting: *Deleted

## 2023-03-11 NOTE — Telephone Encounter (Signed)
  Chief Complaint: Medication request- antibiotic for UTI Symptoms: frequency, urgency, starting to have burning at end of stream Frequency: symptoms started 2 days ago Pertinent Negatives: Patient denies fever, blood in urine Disposition: [] ED /[] Urgent Care (no appt availability in office) / [] Appointment(In office/virtual)/ []  [facility]/ [] Home Care/ [x] Refused Recommended Disposition /[] [facility]/ []  Follow-up with PCP Additional Notes: Patient states she is leaving to go out of town- [place]- late this afternoon- she does not drive and she is afraid she has developed a urinary tract infection. Patient is requesting an antibiotic from PCP. Patient states she will find out what pharmacy is closed to where she will be staying if her provider would be willing to prescribe medication- please notify her. Patient is aware of office policy- but states she does not have transportation to come to office today.

## 2023-03-11 NOTE — Telephone Encounter (Signed)
Informed patient with message

## 2023-03-11 NOTE — Telephone Encounter (Signed)
Summary: Possible UTI - requesting Rx   Pt traveling to [place] today around 4pm. Pt requesting prescription to be sent in for possible UTI. Pt unable to schedule appt due to lack of transportation. Pt urinating frequently and has burning sensation when urinating.         Reason for Disposition  All other patients with painful urination  (Exception: [1] EITHER frequency or urgency AND [2] has on-call doctor.)  Answer Assessment - Initial Assessment Questions 1. SEVERITY: "How bad is the pain?"  (e.g., Scale 1-10; mild, moderate, or severe)   - MILD (1-3): complains slightly about urination hurting   - MODERATE (4-7): interferes with normal activities     - SEVERE (8-10): excruciating, unwilling or unable to urinate because of the pain      Mild/moderate 2. FREQUENCY: "How many times have you had painful urination today?"      Last night got up 3 times- frequency 3. PATTERN: "Is pain present every time you urinate or just sometimes?"      Burning sensation- not every time 4. ONSET: "When did the painful urination start?"      2 days ago 5. FEVER: "Do you have a fever?" If Yes, ask: "What is your temperature, how was it measured, and when did it start?"     no 6. PAST UTI: "Have you had a urine infection before?" If Yes, ask: "When was the last time?" and "What happened that time?"      Not many 7. CAUSE: "What do you think is causing the painful urination?"  (e.g., UTI, scratch, Herpes sore)     UTI 8. OTHER SYMPTOMS: "Do you have any other symptoms?" (e.g., blood in urine, flank pain, genital sores, urgency, vaginal discharge)     Urgency is worse  Protocols used: Urination Pain - Female-A-AH

## 2023-03-13 DIAGNOSIS — J449 Chronic obstructive pulmonary disease, unspecified: Secondary | ICD-10-CM | POA: Diagnosis not present

## 2023-03-15 DIAGNOSIS — N3001 Acute cystitis with hematuria: Secondary | ICD-10-CM | POA: Diagnosis not present

## 2023-04-10 DIAGNOSIS — J449 Chronic obstructive pulmonary disease, unspecified: Secondary | ICD-10-CM | POA: Diagnosis not present

## 2023-04-19 ENCOUNTER — Ambulatory Visit: Payer: Self-pay | Admitting: Family Medicine

## 2023-04-19 NOTE — Telephone Encounter (Signed)
 Copied from CRM [id number]. Topic: Clinical - Red Word Triage >> Apr 19, 2023  9:41 AM Alessandra Bevels wrote: Red Word that prompted transfer to Nurse Triage: Patient is calling to report Right arm pain for 5 weeks Pain 8/10. Please advise   Reason for Disposition  Nursing judgment or information in reference  Answer Assessment - Initial Assessment Questions 1. REASON FOR CALL: "What is your main concern right now?"     Just wanted an apt.  Declined triage; just wanted to schedule an apt.  Protocols used: No Guideline Available-A-AH

## 2023-04-20 ENCOUNTER — Ambulatory Visit
Admission: RE | Admit: 2023-04-20 | Discharge: 2023-04-20 | Disposition: A | Discharge status: Home / Self Care | Source: Ambulatory Visit | Attending: Family Medicine | Admitting: Family Medicine

## 2023-04-20 ENCOUNTER — Encounter: Payer: Self-pay | Admitting: Family Medicine

## 2023-04-20 ENCOUNTER — Ambulatory Visit
Admission: RE | Admit: 2023-04-20 | Discharge: 2023-04-20 | Disposition: A | Discharge status: Home / Self Care | Attending: Family Medicine | Admitting: Family Medicine

## 2023-04-20 ENCOUNTER — Ambulatory Visit (INDEPENDENT_AMBULATORY_CARE_PROVIDER_SITE_OTHER): Admitting: Family Medicine

## 2023-04-20 VITALS — BP 118/78 | Ht 66.0 in | Wt 123.4 lb

## 2023-04-20 DIAGNOSIS — M25511 Pain in right shoulder: Secondary | ICD-10-CM | POA: Diagnosis not present

## 2023-04-20 DIAGNOSIS — M778 Other enthesopathies, not elsewhere classified: Secondary | ICD-10-CM | POA: Diagnosis not present

## 2023-04-20 DIAGNOSIS — M19011 Primary osteoarthritis, right shoulder: Secondary | ICD-10-CM | POA: Diagnosis not present

## 2023-04-20 NOTE — Progress Notes (Signed)
 Date:  04/20/2023   Name:  Marie Martin   DOB:  [birth year]   MRN:  [id number]   Chief Complaint: Shoulder Pain (Patient said she has right arm pain 6-8 weeks )  Shoulder Pain  The pain is present in the right shoulder. This is a new problem. The current episode started more than 1 month ago. There has been a history of trauma (fractureleft humerus 2/23). The quality of the pain is described as aching. The pain is moderate. Associated symptoms include a limited range of motion and stiffness. Pertinent negatives include no fever, inability to bear weight, joint swelling or numbness. The symptoms are aggravated by activity. She has tried acetaminophen and NSAIDS (topical voltaren) for the symptoms. The treatment provided no relief.    Lab Results  Component Value Date   NA 142 07/11/2019   K 4.8 07/11/2019   CO2 23 07/11/2019   GLUCOSE 89 07/11/2019   BUN 22 07/11/2019   CREATININE 1.27 (H) 07/11/2019   CALCIUM 8.9 07/11/2019   GFRNONAA 38 (L) 07/11/2019   Lab Results  Component Value Date   CHOL 255 (H) 07/11/2019   HDL 61 07/11/2019   LDLCALC 169 (H) 07/11/2019   TRIG 141 07/11/2019   CHOLHDL 3.7 12/02/2015   No results found for: "TSH" No results found for: "HGBA1C" Lab Results  Component Value Date   WBC 8.2 06/10/2021   HGB 12.3 06/10/2021   HCT 36.9 06/10/2021   MCV 93 06/10/2021   PLT 249 06/10/2021   No results found for: "ALT", "AST", "GGT", "ALKPHOS", "BILITOT" No results found for: "25OHVITD2", "25OHVITD3", "VD25OH"   Review of Systems  Constitutional:  Negative for fever.  Respiratory:  Positive for cough, shortness of breath and wheezing.   Cardiovascular:  Negative for chest pain.  Gastrointestinal:  Negative for blood in stool.  Musculoskeletal:  Positive for myalgias and stiffness. Negative for joint swelling and neck stiffness.  Neurological:  Negative for numbness.    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  Current Meds  Medication Sig   albuterol (PROVENTIL) (2.5 MG/3ML) 0.083% nebulizer solution USE 1 VIAL IN NEBULIZER EVERY 6 HOURS AS NEEDED FOR WHEEZING FOR SHORTNESS OF BREATH   Ascorbic Acid (VITAMIN C WITH ROSE HIPS) 1000 MG tablet Take 1,000 mg by mouth daily.   benzonatate (TESSALON PERLES) 100 MG capsule Take 1 capsule (100 mg total) by mouth 3 (three) times daily as needed for cough.   Fluticasone-Umeclidin-Vilant (TRELEGY ELLIPTA) 100-62.5-25 MCG/ACT AEPB INHALE 1 PUFF ONCE DAILY   Garlic 100 MG TABS Take by mouth.   Multiple Vitamins-Minerals (MULTIVITAMIN WOMENS 50+ ADV) TABS Take by mouth.  sertraline (ZOLOFT) 100 MG tablet TAKE 1 TABLET BY MOUTH DAILY. GENERIC EQUIVALENT FOR ZOLOFT   vitamin B-12 (CYANOCOBALAMIN) 1000 MCG tablet Take 1,000 mcg by mouth daily.   vitamin E 180 MG (400 UNITS) capsule Take 400 Units by mouth daily.   Current Facility-Administered Medications for the 04/20/23 encounter (Office Visit) with Duanne Limerick, MD  Medication   ipratropium-albuterol (DUONEB) 0.5-2.5 (3) MG/3ML nebulizer solution 3 mL       04/20/2023    8:31 AM 10/30/2022    8:50 AM 12/31/2021    9:07 AM 06/10/2021    8:39 AM  GAD 7 : Generalized Anxiety Score  Nervous,  Anxious, on Edge 0 0 0 0  Control/stop worrying 0 0 0 0  Worry too much - different things 0 0 0 0  Trouble relaxing 0 0 0 0  Restless 0 0 0 0  Easily annoyed or irritable 0 0 0 0  Afraid - awful might happen 0 0 0 0  Total GAD 7 Score 0 0 0 0  Anxiety Difficulty Not difficult at all Not difficult at all Not difficult at all Not difficult at all       04/20/2023    8:31 AM 10/30/2022    8:50 AM 12/31/2021    9:07 AM  Depression screen PHQ 2/9  Decreased Interest 0 0 0  Down, Depressed, Hopeless 0 0 0  PHQ - 2 Score 0 0 0  Altered sleeping 0 0 0  Tired, decreased energy 2 0 0  Change in appetite 0 0 0  Feeling bad or failure about yourself  0 0 0  Trouble concentrating 0 0 0  Moving slowly or fidgety/restless 0 0 0  Suicidal thoughts 0 0 0  PHQ-9 Score 2 0 0  Difficult doing work/chores Not difficult at all Not difficult at all Not difficult at all    BP Readings from Last 3 Encounters:  04/20/23 118/78  10/30/22 122/78  01/26/22 124/78    Physical Exam Vitals and nursing note reviewed.  Constitutional:      General: She is not in acute distress.    Appearance: She is not diaphoretic.  HENT:     Head: Normocephalic and atraumatic.     Right Ear: External ear normal.     Left Ear: External ear normal.     Nose: Nose normal.  Eyes:     General:        Right eye: No discharge.        Left eye: No discharge.     Conjunctiva/sclera: Conjunctivae normal.     Pupils: Pupils are equal, round, and reactive to light.  Neck:     Thyroid: No thyromegaly.     Vascular: No JVD.  Cardiovascular:     Rate and Rhythm: Normal rate and regular rhythm.     Heart sounds: Normal heart sounds. No murmur heard.    No friction rub. No gallop.  Pulmonary:     Effort: Pulmonary effort is normal.     Breath sounds: Normal breath sounds.  Abdominal:     General: Bowel sounds are normal.     Palpations: Abdomen is soft. There is no mass.     Tenderness: There is no abdominal  tenderness. There is no guarding.  Musculoskeletal:     Right shoulder: Tenderness and bony tenderness present. No swelling or crepitus. Decreased range of motion. Normal strength. Normal pulse.     Right upper arm: Tenderness present.  Cervical back: Normal range of motion and neck supple.     Comments: Tender acetabulum/triceps/proximal/tender mid to distal humerus  Lymphadenopathy:     Cervical: No cervical adenopathy.  Skin:    General: Skin is warm and dry.  Neurological:     Mental Status: She is alert.     Deep Tendon Reflexes: Reflexes are normal and symmetric.     Wt Readings from Last 3 Encounters:  04/20/23 123 lb 6 oz (56 kg)  10/30/22 131 lb (59.4 kg)  01/26/22 132 lb (59.9 kg)    BP 118/78   Ht 5\' 6"  (1.676 m)   Wt 123 lb 6 oz (56 kg)   BMI 19.91 kg/m   Assessment and Plan:  1. Acute pain of right shoulder (Primary) Acute right shoulder pain over 6 weeks with limited range of motion and tenderness over the triceps proximal tendon and anterior acetabulum.  Chronic because of previous history of fractured humerus with tenderness from mid to distal humerus.  We will obtain x-rays of the shoulder and humerus and refer to Dr. Ashley Royalty for evaluation. - DG Shoulder Right - DG Humerus Right    Elizabeth Sauer, MD

## 2023-04-21 ENCOUNTER — Encounter: Payer: Self-pay | Admitting: Family Medicine

## 2023-04-21 ENCOUNTER — Other Ambulatory Visit (INDEPENDENT_AMBULATORY_CARE_PROVIDER_SITE_OTHER): Payer: Self-pay | Admitting: Radiology

## 2023-04-21 ENCOUNTER — Ambulatory Visit: Admitting: Family Medicine

## 2023-04-21 VITALS — BP 114/60 | HR 84 | Ht 66.0 in | Wt 126.0 lb

## 2023-04-21 DIAGNOSIS — M19011 Primary osteoarthritis, right shoulder: Secondary | ICD-10-CM

## 2023-04-21 MED ORDER — TRIAMCINOLONE ACETONIDE 40 MG/ML IJ SUSP
40.0000 mg | Freq: Once | INTRAMUSCULAR | Status: AC
  Administered 2023-04-21: 40 mg via INTRAMUSCULAR

## 2023-04-21 NOTE — Patient Instructions (Signed)
 You have just been given a cortisone injection to reduce pain and inflammation. After the injection you may notice immediate relief of pain as a result of the Lidocaine. It is important to rest the area of the injection for 24 to 48 hours after the injection. There is a possibility of some temporary increased discomfort and swelling for up to 72 hours until the cortisone begins to work. If you do have pain, simply rest the joint and use ice. If you can tolerate over the counter medications, you can try Tylenol, Aleve, or Advil for added relief per package instructions. - As above, relative rest x 2 days then return to usual activity - Once symptoms respond to cortisone, start home exercises with information provided - Can use Tylenol and/or ibuprofen as-needed sparingly for additional pain control - Follow-up as-needed

## 2023-04-21 NOTE — Progress Notes (Signed)
 Primary Care / Sports Medicine Office Visit  Patient Information:  Patient ID: Marie Martin, female DOB: 03-Apr-1931 Age: 89 y.o. MRN: [id number]   Marie Martin is a pleasant 89 y.o. female presenting with the following:  Chief Complaint  Patient presents with   Arm Pain    Right arm pain x 6-8 weeks. Difficulty lifting and getting dressed. Xrays taken on 04/20/23.    Vitals:   04/21/23 0755  BP: 114/60  Pulse: 84  SpO2: 94%   Vitals:   04/21/23 0755  Weight: 126 lb (57.2 kg)  Height: 5\' 6"  (1.676 m)   Body mass index is 20.34 kg/m.  No results found.   Independent interpretation of notes and tests performed by another provider:   Independent interpretation of right shoulder and humerus x-rays dated 04/20/2023 demonstrates significant joint space loss at the glenohumeral articulation, deformation of the humeral head and proximal shaft consistent with prior healed fracture, osteophyte formation noted.  Procedures performed:   Procedure:  Injection of right shoulder joint under ultrasound guidance. Ultrasound guidance utilized for out of plane posterior approach to glenohumeral joint, cortical irregularities consistent with degenerative changes noted sonographically Samsung HS60 device utilized with permanent recording / reporting. Verbal informed consent obtained and verified. Skin prepped in a sterile fashion. Ethyl chloride for topical local analgesia.  Completed without difficulty and tolerated well. Medication: triamcinolone acetonide 40 mg/mL suspension for injection 1 mL total and 2 mL lidocaine 1% without epinephrine utilized for needle placement anesthetic Advised to contact for fevers/chills, erythema, induration, drainage, or persistent bleeding.   Pertinent History, Exam, Impression, and Recommendations:   Problem List Items Addressed This Visit     Osteoarthritis of glenohumeral joint, right - Primary   History of Present Illness The patient  presents with right shoulder pain. She is accompanied by her daughter.  She has been experiencing right shoulder pain for the past six to eight weeks.  This is an acute on chronic issue.  The pain started gradually and extends from the elbow up to the shoulder. There was no specific injury, but she mentions that her small dog pulling on the leash may have contributed to the pain. She is right-handed and finds it difficult to perform activities such as getting dressed, especially when raising her arm over her head.  The pain sometimes occurs at night, particularly when trying to get comfortable in bed. She avoids lying on the affected side and prefers to rest her arm on her head when lying on her left side. Movements such as rolling over in bed can exacerbate the pain until she finds a comfortable position. The shoulder pain impacts her ability to perform daily activities, such as doing dishes and making the bed.  She has been using Salonpas patches, Tylenol, and ibuprofen intermittently to manage the pain. She takes Tylenol more consistently, often two tablets before bed, but she is not a regular pill user. The effectiveness of these treatments has been variable.  Her past medical history includes a fall in March 2023, which resulted in a broken hip and shoulder.  No sudden sharp pain, but she describes the pain as gradually settling. She notes nighttime pain and difficulty with certain movements.  Physical Exam PALPATION: Tenderness in right bicipital groove. Non-tender subacromial area and trapezius. RANGE OF MOTION: Limited ROM in right shoulder flexion to 85 degrees, limited by pain. Limited abduction in right shoulder compared to left. Limited external rotation in right shoulder. Full and painless ROM  in left shoulder. STRENGTH: 4/5 strength in right shoulder lateral rotation. 5/5 strength and painless in right shoulder internal rotation. 4-/5 strength in right shoulder supraspinatus  testing. SPECIAL TESTS: Positive crepitus with passive motion, positive Neer's test, positive Hawkins-Kennedy test, equivocal Speed's test, and positive Load and Shift test in right shoulder.  Results LABS Kidney function: Elevated creatinine (04/2021)  RADIOLOGY Shoulder X-ray: Narrowed joint space, altered humeral head shape consistent with old fracture (04/20/2023)  Assessment and Plan Right Shoulder Osteoarthritis related arthralgia Chronic right shoulder pain and limited range of motion due to osteoarthritis, exacerbated by previous injury. X-rays confirm narrowed joint space and altered humeral head. Cortisone injection indicated to avoid /minimize oral NSAIDs given prior azotemia. - Administer cortisone injection to the right shoulder joint under ultrasound guidance today. - Advise use of Voltaren gel on an as-needed basis. - Discuss potential use of meloxicam in the future if needed. - Instruct to avoid aggressive activities with the right arm for 2 days post-injection. - Encourage home exercises to strengthen rotator cuff muscles once pain is reduced.  Utilize [facility] materials provided in office today.      Relevant Orders   [place] LIMITED JOINT SPACE STRUCTURES UP RIGHT     Orders & Medications Medications:  Meds ordered this encounter  Medications   triamcinolone acetonide (KENALOG-40) injection 40 mg   Orders Placed This Encounter  Procedures   [place] LIMITED JOINT SPACE STRUCTURES UP RIGHT     No follow-ups on file.     Marie Banana, MD, [facility]   Primary Care Sports Medicine Primary Care and Sports Medicine at [facility]

## 2023-04-21 NOTE — Assessment & Plan Note (Signed)
 History of Present Illness The patient presents with right shoulder pain. She is accompanied by her daughter.  She has been experiencing right shoulder pain for the past six to eight weeks.  This is an acute on chronic issue.  The pain started gradually and extends from the elbow up to the shoulder. There was no specific injury, but she mentions that her small dog pulling on the leash may have contributed to the pain. She is right-handed and finds it difficult to perform activities such as getting dressed, especially when raising her arm over her head.  The pain sometimes occurs at night, particularly when trying to get comfortable in bed. She avoids lying on the affected side and prefers to rest her arm on her head when lying on her left side. Movements such as rolling over in bed can exacerbate the pain until she finds a comfortable position. The shoulder pain impacts her ability to perform daily activities, such as doing dishes and making the bed.  She has been using Salonpas patches, Tylenol, and ibuprofen intermittently to manage the pain. She takes Tylenol more consistently, often two tablets before bed, but she is not a regular pill user. The effectiveness of these treatments has been variable.  Her past medical history includes a fall in March 2023, which resulted in a broken hip and shoulder.  No sudden sharp pain, but she describes the pain as gradually settling. She notes nighttime pain and difficulty with certain movements.  Physical Exam PALPATION: Tenderness in right bicipital groove. Non-tender subacromial area and trapezius. RANGE OF MOTION: Limited ROM in right shoulder flexion to 85 degrees, limited by pain. Limited abduction in right shoulder compared to left. Limited external rotation in right shoulder. Full and painless ROM in left shoulder. STRENGTH: 4/5 strength in right shoulder lateral rotation. 5/5 strength and painless in right shoulder internal rotation. 4-/5 strength in  right shoulder supraspinatus testing. SPECIAL TESTS: Positive crepitus with passive motion, positive Neer's test, positive Hawkins-Kennedy test, equivocal Speed's test, and positive Load and Shift test in right shoulder.  Results LABS Kidney function: Elevated creatinine (04/2021)  RADIOLOGY Shoulder X-ray: Narrowed joint space, altered humeral head shape consistent with old fracture (04/20/2023)  Assessment and Plan Right Shoulder Osteoarthritis related arthralgia Chronic right shoulder pain and limited range of motion due to osteoarthritis, exacerbated by previous injury. X-rays confirm narrowed joint space and altered humeral head. Cortisone injection indicated to avoid /minimize oral NSAIDs given prior azotemia. - Administer cortisone injection to the right shoulder joint under ultrasound guidance today. - Advise use of Voltaren gel on an as-needed basis. - Discuss potential use of meloxicam in the future if needed. - Instruct to avoid aggressive activities with the right arm for 2 days post-injection. - Encourage home exercises to strengthen rotator cuff muscles once pain is reduced.  Utilize AAOS materials provided in office today.

## 2023-05-10 ENCOUNTER — Ambulatory Visit: Payer: Self-pay

## 2023-05-10 NOTE — Telephone Encounter (Signed)
 Copied from CRM [id number]. Topic: Clinical - Red Word Triage >> May 10, 2023  8:47 AM Tiffany B wrote: Red Word that prompted transfer to Nurse Triage: Patient experiencing shortness of breath and a COPD flare up.   Chief Complaint: Dyspnea  Symptoms: Coughing, Fatigue  Frequency: 3-4 Days  Pertinent Negatives: Patient denies fever, chest pain  Disposition: [] ED /[] Urgent Care (no appt availability in office) / [x] Appointment(In office/virtual)/ []  [place] [facility]/ [] Home Care/ [] Refused Recommended Disposition /[] [place] [facility]/ []  Follow-up with PCP Additional Notes: BB is being triaged for coughing-productive, fatigue, and nasal congestion that has been going on for 3-4 days. The patient has a history of COPD and denies chest pain or dyspnea outside of baseline. The patient can only come in office tomorrow due to transportation, scheduled in office with PCP for tomorrow.   Reason for Disposition  [1] Known COPD or other severe lung disease (i.e., bronchiectasis, cystic fibrosis, lung surgery) AND [2] worsening symptoms (i.e., increased sputum purulence or amount, increased breathing difficulty  Answer Assessment - Initial Assessment Questions 1. ONSET: "When did the cough begin?"      3-4 Dya s 2. SEVERITY: "How bad is the cough today?"      Continuous  3. SPUTUM: "Describe the color of your sputum" (none, dry cough; clear, white, yellow, green)     Dark Green  4. HEMOPTYSIS: "Are you coughing up any blood?" If so ask: "How much?" (flecks, streaks, tablespoons, etc.)     No  5. DIFFICULTY BREATHING: "Are you having difficulty breathing?" If Yes, ask: "How bad is it?" (e.g., mild, moderate, severe)    - MILD: No SOB at rest, mild SOB with walking, speaks normally in sentences, can lie down, no retractions, pulse < 100.    - MODERATE: SOB at rest, SOB with minimal exertion and prefers to sit, cannot lie down flat, speaks in phrases, mild retractions, audible  wheezing, pulse 100-120.    - SEVERE: Very SOB at rest, speaks in single words, struggling to breathe, sitting hunched forward, retractions, pulse > 120      No more than usua  6. FEVER: "Do you have a fever?" If Yes, ask: "What is your temperature, how was it measured, and when did it start?"     No  7. CARDIAC HISTORY: "Do you have any history of heart disease?" (e.g., heart attack, congestive heart failure)      No  8. LUNG HISTORY: "Do you have any history of lung disease?"  (e.g., pulmonary embolus, asthma, emphysema)     COPD  9. PE RISK FACTORS: "Do you have a history of blood clots?" (or: recent major surgery, recent prolonged travel, bedridden)     No  10. OTHER SYMPTOMS: "Do you have any other symptoms?" (e.g., runny nose, wheezing, chest pain)       Coughing, Fatigue, Nasal Congestion  11. PREGNANCY: "Is there any chance you are pregnant?" "When was your last menstrual period?"       No and No  12. TRAVEL: "Have you traveled out of the country in the last month?" (e.g., travel history, exposures)       No  Protocols used: Cough - Acute Productive-A-AH

## 2023-05-11 ENCOUNTER — Ambulatory Visit (INDEPENDENT_AMBULATORY_CARE_PROVIDER_SITE_OTHER): Admitting: Family Medicine

## 2023-05-11 ENCOUNTER — Encounter: Payer: Self-pay | Admitting: Family Medicine

## 2023-05-11 VITALS — BP 118/70 | HR 55 | Temp 97.6°F | Ht 66.0 in | Wt 117.0 lb

## 2023-05-11 DIAGNOSIS — J441 Chronic obstructive pulmonary disease with (acute) exacerbation: Secondary | ICD-10-CM | POA: Diagnosis not present

## 2023-05-11 DIAGNOSIS — J449 Chronic obstructive pulmonary disease, unspecified: Secondary | ICD-10-CM | POA: Diagnosis not present

## 2023-05-11 MED ORDER — AZITHROMYCIN 250 MG PO TABS: ORAL_TABLET | ORAL | 0 refills | Status: AC

## 2023-05-11 MED ORDER — PREDNISONE 10 MG PO TABS: 10.0000 mg | ORAL_TABLET | Freq: Every day | ORAL | 0 refills | Status: AC

## 2023-05-11 NOTE — Progress Notes (Signed)
 Date:  05/11/2023   Name:  Marie Martin   DOB:  28-Mar-1931   MRN:  [id number]   Chief Complaint: Cough (Present a week, been taking mucinex)  Cough This is a chronic problem. The current episode started more than 1 year ago. The problem has been gradually improving. The problem occurs constantly. The cough is Productive of purulent sputum. Associated symptoms include postnasal drip, shortness of breath and wheezing. Pertinent negatives include no chest pain, chills, ear congestion, ear pain, fever, headaches, heartburn, hemoptysis, myalgias, nasal congestion, rash, rhinorrhea, sore throat, sweats or weight loss. Risk factors for lung disease include animal exposure. She has tried a beta-agonist inhaler and OTC cough suppressant for the symptoms. The treatment provided moderate relief.    Lab Results  Component Value Date   NA 142 07/11/2019   K 4.8 07/11/2019   CO2 23 07/11/2019   GLUCOSE 89 07/11/2019   BUN 22 07/11/2019   CREATININE 1.27 (H) 07/11/2019   CALCIUM 8.9 07/11/2019   GFRNONAA 38 (L) 07/11/2019   Lab Results  Component Value Date   CHOL 255 (H) 07/11/2019   HDL 61 07/11/2019   LDLCALC 169 (H) 07/11/2019   TRIG 141 07/11/2019   CHOLHDL 3.7 12/02/2015   No results found for: "TSH" No results found for: "HGBA1C" Lab Results  Component Value Date   WBC 8.2 06/10/2021   HGB 12.3 06/10/2021   HCT 36.9 06/10/2021   MCV 93 06/10/2021   PLT 249 06/10/2021   No results found for: "ALT", "AST", "GGT", "ALKPHOS", "BILITOT" No results found for: "25OHVITD2", "25OHVITD3", "VD25OH"   Review of Systems  Constitutional:  Negative for chills, fever and weight loss.  HENT:  Positive for postnasal drip. Negative for ear pain, rhinorrhea and sore throat.   Respiratory:  Positive for cough, shortness of breath and wheezing. Negative for hemoptysis and chest tightness.   Cardiovascular:  Negative for chest pain, palpitations and leg swelling.  Gastrointestinal:  Negative  for heartburn.  Musculoskeletal:  Negative for myalgias.  Skin:  Negative for rash.  Neurological:  Negative for headaches.    Patient Active Problem List   Diagnosis Date Noted   Osteoarthritis of glenohumeral joint, right 04/21/2023   COPD exacerbation (HCC) 02/22/2017   Cough 02/22/2017   Depression 02/22/2017   SOB (shortness of breath) 02/22/2017   Centriacinar emphysema (HCC) 05/31/2014   Recurrent major depressive episodes (HCC) 05/31/2014    Allergies  Allergen Reactions   Amoxicillin-Pot Clavulanate Nausea Only and Other (See Comments)    Other Reaction: trouble swallowing   Penicillins     Past Surgical History:  Procedure Laterality Date   TONSILLECTOMY      Social History   Tobacco Use   Smoking status: Former   Smokeless tobacco: Never  Vaping Use   Vaping status: Never Used  Substance Use Topics   Alcohol use: No    Alcohol/week: 0.0 standard drinks of alcohol   Drug use: No     Medication list has been reviewed and updated.  Current Meds  Medication Sig   albuterol (PROVENTIL) (2.5 MG/3ML) 0.083% nebulizer solution USE 1 VIAL IN NEBULIZER EVERY 6 HOURS AS NEEDED FOR WHEEZING FOR SHORTNESS OF BREATH   Ascorbic Acid (VITAMIN C WITH ROSE HIPS) 1000 MG tablet Take 1,000 mg by mouth daily.   Fluticasone-Umeclidin-Vilant (TRELEGY ELLIPTA) 100-62.5-25 MCG/ACT AEPB INHALE 1 PUFF ONCE DAILY   Pseudoephedrine-guaiFENesin (MUCINEX D PO) Take by mouth.   sertraline (ZOLOFT) 100 MG tablet TAKE 1  TABLET BY MOUTH DAILY. GENERIC EQUIVALENT FOR ZOLOFT   vitamin B-12 (CYANOCOBALAMIN) 1000 MCG tablet Take 1,000 mcg by mouth daily.   vitamin E 180 MG (400 UNITS) capsule Take 400 Units by mouth daily.   Current Facility-Administered Medications for the 05/11/23 encounter (Office Visit) with Duanne Limerick, MD  Medication   ipratropium-albuterol (DUONEB) 0.5-2.5 (3) MG/3ML nebulizer solution 3 mL       04/20/2023    8:31 AM 10/30/2022    8:50 AM 12/31/2021     9:07 AM 06/10/2021    8:39 AM  GAD 7 : Generalized Anxiety Score  Nervous, Anxious, on Edge 0 0 0 0  Control/stop worrying 0 0 0 0  Worry too much - different things 0 0 0 0  Trouble relaxing 0 0 0 0  Restless 0 0 0 0  Easily annoyed or irritable 0 0 0 0  Afraid - awful might happen 0 0 0 0  Total GAD 7 Score 0 0 0 0  Anxiety Difficulty Not difficult at all Not difficult at all Not difficult at all Not difficult at all       04/20/2023    8:31 AM 10/30/2022    8:50 AM 12/31/2021    9:07 AM  Depression screen PHQ 2/9  Decreased Interest 0 0 0  Down, Depressed, Hopeless 0 0 0  PHQ - 2 Score 0 0 0  Altered sleeping 0 0 0  Tired, decreased energy 2 0 0  Change in appetite 0 0 0  Feeling bad or failure about yourself  0 0 0  Trouble concentrating 0 0 0  Moving slowly or fidgety/restless 0 0 0  Suicidal thoughts 0 0 0  PHQ-9 Score 2 0 0  Difficult doing work/chores Not difficult at all Not difficult at all Not difficult at all    BP Readings from Last 3 Encounters:  05/11/23 118/70  04/21/23 114/60  04/20/23 118/78    Physical Exam Vitals and nursing note reviewed.  Constitutional:      General: She is not in acute distress.    Appearance: She is not diaphoretic.  HENT:     Head: Normocephalic and atraumatic.     Right Ear: Tympanic membrane and external ear normal.     Left Ear: Tympanic membrane and external ear normal.     Nose: Nose normal.     Mouth/Throat:     Mouth: Mucous membranes are moist.  Eyes:     General:        Right eye: No discharge.        Left eye: No discharge.     Conjunctiva/sclera: Conjunctivae normal.     Pupils: Pupils are equal, round, and reactive to light.  Neck:     Thyroid: No thyromegaly.     Vascular: No JVD.  Cardiovascular:     Rate and Rhythm: Normal rate and regular rhythm.     Heart sounds: Normal heart sounds. No murmur heard.    No friction rub. No gallop.  Pulmonary:     Effort: Pulmonary effort is normal. No respiratory  distress.     Breath sounds: Normal breath sounds. No wheezing, rhonchi or rales.  Abdominal:     General: Bowel sounds are normal.     Palpations: Abdomen is soft. There is no mass.     Tenderness: There is no abdominal tenderness. There is no guarding.  Musculoskeletal:        General: Normal range of motion.  Cervical back: Normal range of motion and neck supple.  Lymphadenopathy:     Cervical: No cervical adenopathy.  Skin:    General: Skin is warm and dry.  Neurological:     Mental Status: She is alert.     Deep Tendon Reflexes: Reflexes are normal and symmetric.     Wt Readings from Last 3 Encounters:  05/11/23 117 lb (53.1 kg)  04/21/23 126 lb (57.2 kg)  04/20/23 123 lb 6 oz (56 kg)    BP 118/70   Pulse (!) 55   Temp 97.6 F (36.4 C) (Oral)   Ht 5\' 6"  (1.676 m)   Wt 117 lb (53.1 kg)   SpO2 97% Comment: 3L poc  BMI 18.88 kg/m   Assessment and Plan:  1. Chronic obstructive pulmonary disease with acute exacerbation (HCC) (Primary) Chronic.  Controlled.  Stable.  Recent exacerbation with gradual development of purulent sputum.  I currently stable but trending towards concern.  We will initiate prednisone 10 mg once a day for 10 days and will hold afterwards or if continued cough and productive sputum continue 4 days and then hold.  In the meantime we will initiate azithromycin to 50 mg 2 today followed by 1 a day for 4 days. - azithromycin (ZITHROMAX) 250 MG tablet; Take 2 tablets on day 1, then 1 tablet daily on days 2 through 5  Dispense: 6 tablet; Refill: 0 - predniSONE (DELTASONE) 10 MG tablet; Take 1 tablet (10 mg total) by mouth daily with breakfast.  Dispense: 30 tablet; Refill: 0    Elizabeth Sauer, MD

## 2023-06-07 DIAGNOSIS — Z9981 Dependence on supplemental oxygen: Secondary | ICD-10-CM | POA: Diagnosis not present

## 2023-06-07 DIAGNOSIS — J449 Chronic obstructive pulmonary disease, unspecified: Secondary | ICD-10-CM | POA: Diagnosis not present

## 2023-08-10 ENCOUNTER — Encounter: Payer: Self-pay | Admitting: Student

## 2023-08-10 ENCOUNTER — Ambulatory Visit (INDEPENDENT_AMBULATORY_CARE_PROVIDER_SITE_OTHER): Admitting: Student

## 2023-08-10 ENCOUNTER — Ambulatory Visit: Admitting: Student

## 2023-08-10 VITALS — BP 116/70 | Ht 66.0 in | Wt 120.1 lb

## 2023-08-10 DIAGNOSIS — F325 Major depressive disorder, single episode, in full remission: Secondary | ICD-10-CM | POA: Diagnosis not present

## 2023-08-10 DIAGNOSIS — J441 Chronic obstructive pulmonary disease with (acute) exacerbation: Secondary | ICD-10-CM

## 2023-08-10 DIAGNOSIS — N1831 Chronic kidney disease, stage 3a: Secondary | ICD-10-CM | POA: Diagnosis not present

## 2023-08-10 MED ORDER — ALBUTEROL SULFATE (2.5 MG/3ML) 0.083% IN NEBU: INHALATION_SOLUTION | RESPIRATORY_TRACT | 3 refills | Status: AC

## 2023-08-10 MED ORDER — AZITHROMYCIN 250 MG PO TABS: ORAL_TABLET | ORAL | 0 refills | Status: AC

## 2023-08-10 MED ORDER — SERTRALINE HCL 100 MG PO TABS: ORAL_TABLET | ORAL | 3 refills | Status: AC

## 2023-08-10 NOTE — Progress Notes (Signed)
 Established Patient Office Visit  Subjective   Patient ID: Marie Martin, female    DOB: [birth year]  Age: 89 y.o. MRN: [id number]  Chief Complaint  Patient presents with   COPD   She lives with daughter, son in law, and grandchildren. Is home alone in the daytime with dog. Ambulates short distance due to difficultly with carrying oxygen . Denies recent falls  COPD ON 3 L of O2 on [place], feels that she has increased yellow/green mucous production and cough, no increase in dyspnea. Uses mucinex  at night to help with secretions. She denies fevers or chills,  dizziness, lightheadedness, or falls.   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    Appearance: Normal appearance.  HENT:     Mouth/Throat:     Mouth: Mucous membranes are dry.     Pharynx: Oropharynx is clear.   Eyes:     Extraocular Movements: Extraocular movements intact.     Conjunctiva/sclera: Conjunctivae normal.     Pupils: Pupils are equal, round, and reactive to light.    Cardiovascular:     Rate and Rhythm: Normal rate and regular rhythm.     Pulses: Normal pulses.     Heart sounds: No murmur heard. Pulmonary:     Effort: Pulmonary effort is normal.     Comments: Mild expiratory wheeze of b/l lung bases, coarse breath sounds Abdominal:     General: Abdomen is flat. Bowel sounds are normal. There is no distension.     Palpations: Abdomen is soft.     Tenderness: There is no abdominal tenderness.    Musculoskeletal:        General: Normal range of motion.     Right lower leg: No edema.     Left lower leg: No edema.   Skin:    General: Skin is warm and dry.     Capillary Refill: Capillary refill takes less than 2 seconds.     Comments: Mildly diminished skin turgor   Neurological:     General: No focal deficit present.     Mental Status: She is alert and oriented to person, place, and time.   Psychiatric:        Mood and Affect: Mood normal.        Behavior: Behavior normal.        08/10/2023    8:11 AM 04/20/2023    8:31 AM 10/30/2022    8:50 AM  Depression screen PHQ 2/9  Decreased Interest 0 0 0  Down, Depressed, Hopeless 0 0 0  PHQ - 2 Score 0 0 0  Altered sleeping 0 0 0  Tired, decreased energy 0 2 0  Change in appetite 0 0 0  Feeling bad or failure about yourself  0 0 0  Trouble concentrating 0 0 0  Moving slowly or fidgety/restless 0 0 0  Suicidal  thoughts 0 0 0  PHQ-9 Score 0 2 0  Difficult doing work/chores Not difficult at all Not difficult at all Not difficult at all       08/10/2023    8:11 AM 04/20/2023    8:31 AM 10/30/2022    8:50 AM 12/31/2021    9:07 AM  GAD 7 : Generalized Anxiety Score  Nervous, Anxious, on Edge 0 0 0 0  Control/stop worrying 0 0 0 0  Worry too much - different things 0 0 0 0  Trouble relaxing 0 0 0 0  Restless 0 0 0 0  Easily annoyed or irritable 0 0 0 0  Afraid - awful might happen 0 0 0 0  Total GAD 7 Score 0 0 0 0  Anxiety Difficulty Not difficult at all Not difficult at all Not difficult at all Not difficult at all    No results found for any visits on 08/10/23.  Last CBC Lab Results  Component Value Date   WBC 8.2 06/10/2021   HGB 12.3 06/10/2021   HCT 36.9 06/10/2021   MCV 93 06/10/2021   MCH 31.1 06/10/2021   RDW 13.8 06/10/2021   PLT 249 06/10/2021   Last metabolic panel Lab Results  Component Value Date   GLUCOSE 89 07/11/2019   NA 142 07/11/2019   K 4.8 07/11/2019   CL 103 07/11/2019   CO2 23  07/11/2019   BUN 22 07/11/2019   CREATININE 1.27 (H) 07/11/2019   GFRNONAA 38 (L) 07/11/2019   CALCIUM 8.9 07/11/2019   PHOS 3.6 07/11/2019   ALBUMIN 4.2 07/11/2019      The ASCVD Risk score (Arnett DK, et al., 2019) failed to calculate for the following reasons:   The 2019 ASCVD risk score is only valid for ages 53 to 50    Assessment & Plan:  COPD exacerbation (HCC) Assessment & Plan: Mild exacerbation today with increase cough a sputum, she has prednisone  left over from last fill in April. Instructed she take 5 days of prednisone  and azithromycin . Encouraged PO hydration to thin secretions.    Orders: -     Albuterol  Sulfate; USE 1 VIAL IN NEBULIZER EVERY 6 HOURS AS NEEDED FOR WHEEZING FOR SHORTNESS OF BREATH  Dispense: 180 mL; Refill: 3  Major depressive disorder with single episode, in full remission [facility]) Assessment & Plan: Mood is well controlled on zoloft  100 mg once day. Has been on this since SO passed in 2007. No prior hx of depression prior to this. Has had periods where she does not take zoloft  and feels mood is more stable on current mediation. Refilled today.   Orders: -     Sertraline  HCl; TAKE 1 TABLET BY MOUTH DAILY. GENERIC EQUIVALENT FOR ZOLOFT   Dispense: 90 tablet; Refill: 3  CKD stage 3a, GFR 45-59 ml/min (HCC) Assessment & Plan: Has not had labs since 2023. Will check BMP at next visit when she is feeling better.    Other orders -     Azithromycin ; Take 2 tablets on day 1, then 1 tablet daily on days 2 through 5  Dispense: 6 tablet; Refill: 0     Return in about 6 months (around 02/10/2024) for chronic f/u.    Harlene Saddler, MD

## 2023-08-10 NOTE — Assessment & Plan Note (Addendum)
 Mood is well controlled on zoloft  100 mg once day. Has been on this since SO passed in 2007. No prior hx of depression prior to this. Has had periods where she does not take zoloft  and feels mood is more stable on current mediation. Refilled today.

## 2023-08-10 NOTE — Assessment & Plan Note (Signed)
 Mild exacerbation today with increase cough a sputum, she has prednisone  left over from last fill in April. Instructed she take 5 days of prednisone  and azithromycin . Encouraged PO hydration to thin secretions.

## 2023-08-10 NOTE — Assessment & Plan Note (Signed)
 Has not had labs since 2023. Will check BMP at next visit when she is feeling better.

## 2023-12-02 DIAGNOSIS — J449 Chronic obstructive pulmonary disease, unspecified: Secondary | ICD-10-CM | POA: Diagnosis not present

## 2023-12-02 DIAGNOSIS — Z9981 Dependence on supplemental oxygen: Secondary | ICD-10-CM | POA: Diagnosis not present

## 2023-12-02 DIAGNOSIS — R0609 Other forms of dyspnea: Secondary | ICD-10-CM | POA: Diagnosis not present

## 2023-12-02 DIAGNOSIS — Z23 Encounter for immunization: Secondary | ICD-10-CM | POA: Diagnosis not present

## 2024-02-14 ENCOUNTER — Ambulatory Visit: Admitting: Student

## 2024-03-06 ENCOUNTER — Ambulatory Visit: Admitting: Student
